# Patient Record
Sex: Female | Born: 1990 | Race: White | Hispanic: No | Marital: Single | Smoking: Current every day smoker
Health system: Southern US, Community
[De-identification: ages and names within clinical notes are randomized; demographics above are authoritative.]

## PROBLEM LIST (undated history)

## (undated) DIAGNOSIS — F603 Borderline personality disorder: Secondary | ICD-10-CM

## (undated) DIAGNOSIS — F329 Major depressive disorder, single episode, unspecified: Secondary | ICD-10-CM

## (undated) DIAGNOSIS — F419 Anxiety disorder, unspecified: Secondary | ICD-10-CM

## (undated) DIAGNOSIS — F4310 Post-traumatic stress disorder, unspecified: Secondary | ICD-10-CM

## (undated) DIAGNOSIS — F32A Depression, unspecified: Secondary | ICD-10-CM

## (undated) DIAGNOSIS — F259 Schizoaffective disorder, unspecified: Secondary | ICD-10-CM

## (undated) DIAGNOSIS — B1920 Unspecified viral hepatitis C without hepatic coma: Secondary | ICD-10-CM

## (undated) DIAGNOSIS — F319 Bipolar disorder, unspecified: Secondary | ICD-10-CM

## (undated) DIAGNOSIS — J45909 Unspecified asthma, uncomplicated: Secondary | ICD-10-CM

## (undated) HISTORY — DX: Anxiety disorder, unspecified: F41.9

## (undated) HISTORY — DX: Major depressive disorder, single episode, unspecified: F32.9

## (undated) HISTORY — DX: Bipolar disorder, unspecified: F31.9

## (undated) HISTORY — PX: TONSILLECTOMY: SUR1361

## (undated) HISTORY — DX: Depression, unspecified: F32.A

## (undated) HISTORY — DX: Post-traumatic stress disorder, unspecified: F43.10

## (undated) HISTORY — DX: Unspecified asthma, uncomplicated: J45.909

---
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
## 2010-10-15 LAB — GLUCOSE, CAPILLARY: Glucose-Capillary: 104 mg/dL — ABNORMAL HIGH (ref 70–99)

## 2010-10-16 LAB — HCG, SERUM, QUALITATIVE: Preg, Serum: NEGATIVE

## 2010-10-16 LAB — TSH: TSH: 9.894 u[IU]/mL — ABNORMAL HIGH (ref 0.700–6.400)

## 2010-10-16 LAB — GLUCOSE, CAPILLARY
Glucose-Capillary: 126 mg/dL — ABNORMAL HIGH (ref 70–99)
Glucose-Capillary: 135 mg/dL — ABNORMAL HIGH (ref 70–99)

## 2010-10-16 LAB — T3, FREE: T3, Free: 3.4 pg/mL (ref 2.3–4.2)

## 2017-08-05 ENCOUNTER — Ambulatory Visit: Payer: Self-pay | Admitting: Licensed Clinical Social Worker

## 2017-08-05 ENCOUNTER — Ambulatory Visit: Payer: Self-pay | Admitting: Urology

## 2017-08-05 VITALS — BP 120/77 | HR 83 | Temp 97.8°F | Wt 259.1 lb

## 2017-08-05 DIAGNOSIS — H539 Unspecified visual disturbance: Secondary | ICD-10-CM

## 2017-08-05 DIAGNOSIS — Z8619 Personal history of other infectious and parasitic diseases: Secondary | ICD-10-CM

## 2017-08-05 DIAGNOSIS — F3163 Bipolar disorder, current episode mixed, severe, without psychotic features: Secondary | ICD-10-CM

## 2017-08-05 DIAGNOSIS — J4520 Mild intermittent asthma, uncomplicated: Secondary | ICD-10-CM

## 2017-08-05 MED ORDER — ALBUTEROL SULFATE HFA 108 (90 BASE) MCG/ACT IN AERS: 2.0000 | INHALATION_SPRAY | Freq: Four times a day (QID) | RESPIRATORY_TRACT | 2 refills | Status: AC | PRN

## 2017-08-05 MED ORDER — NAPROXEN 500 MG PO TABS: 500.0000 mg | ORAL_TABLET | Freq: Two times a day (BID) | ORAL | 0 refills | Status: AC

## 2017-08-05 NOTE — Progress Notes (Signed)
  Patient: Robyn Melton Female    DOB: 12/30/1990   27 y.o.   MRN: [id number] Visit Date: 08/05/2017  Today's Provider: [facility]   Chief Complaint  Patient presents with  . Asthma    needs refill on albuterol inhaler   . Establish Care    inquiring about HepC medication; wants eye appt    Subjective:    HPI Patient would like an eye exam.  "My prescription is getting worse."    Has been out of her inhaler for three days.  Using it 2 to 3 times during the day.    When she was in prison told she could not receive the Hep C drug unless her liver was failing  Allergies  Allergen Reactions  . Banana   . Penicillins    Previous Medications   No medications on file    Review of Systems  Constitutional: Negative.   HENT: Negative.   Eyes: Negative.   Respiratory: Negative.   Cardiovascular: Negative.   Gastrointestinal: Negative.   Endocrine: Negative.   Genitourinary: Negative.   Musculoskeletal: Negative.   Skin: Negative.   Allergic/Immunologic: Negative.   Neurological: Negative.   Hematological: Negative.   Psychiatric/Behavioral: Negative.     Social History   Tobacco Use  . Smoking status: Current Every Day Smoker    Packs/day: 0.20    Types: Cigarettes    Start date: 03/29/2009  . Smokeless tobacco: Never Used  Substance Use Topics  . Alcohol use: No    Frequency: Never   Objective:   BP 120/77 (BP Location: Left Arm, Patient Position: Sitting, Cuff Size: Normal)   Pulse 83   Temp 97.8 F (36.6 C) (Oral)   Wt 259 lb 1.6 oz (117.5 kg)   LMP 07/15/2017 (Exact Date)   Physical Exam Constitutional: Well nourished. Alert and oriented, No acute distress. HEENT: [place] AT, moist mucus membranes. Trachea midline, no masses. Cardiovascular: No clubbing, cyanosis, or edema. Respiratory: Normal respiratory effort, no increased work of breathing. Skin: No rashes, bruises or suspicious lesions. Lymph: No cervical or inguinal  adenopathy. Neurologic: Grossly intact, no focal deficits, moving all 4 extremities. Psychiatric: Normal mood and affect.      Assessment & Plan:     1. Hep C  - refer to GI   - labs drawn tonight  2. Asthma  - albuterol inhaler prescribed  3. HA  - naproxen  RTC in one month         [facility]   [facility]

## 2017-08-06 ENCOUNTER — Encounter: Payer: Self-pay | Admitting: Licensed Clinical Social Worker

## 2017-08-06 NOTE — Progress Notes (Signed)
Clinician completed social determinants screening and PHQ 9. Recommendation for patient to follow up at least once weekly for depression and bipolar disorder.

## 2017-08-07 ENCOUNTER — Ambulatory Visit: Payer: Self-pay | Admitting: Ophthalmology

## 2017-08-12 ENCOUNTER — Ambulatory Visit: Payer: Self-pay

## 2017-08-12 LAB — CBC WITH DIFFERENTIAL/PLATELET
BASOS: 0 %
Basophils Absolute: 0 10*3/uL (ref 0.0–0.2)
EOS (ABSOLUTE): 0.2 10*3/uL (ref 0.0–0.4)
EOS: 2 %
HEMATOCRIT: 40.6 % (ref 34.0–46.6)
HEMOGLOBIN: 13.5 g/dL (ref 11.1–15.9)
IMMATURE GRANS (ABS): 0 10*3/uL (ref 0.0–0.1)
IMMATURE GRANULOCYTES: 0 %
Lymphocytes Absolute: 4 10*3/uL — ABNORMAL HIGH (ref 0.7–3.1)
Lymphs: 40 %
MCH: 27.8 pg (ref 26.6–33.0)
MCHC: 33.3 g/dL (ref 31.5–35.7)
MCV: 84 fL (ref 79–97)
Monocytes Absolute: 0.7 10*3/uL (ref 0.1–0.9)
Monocytes: 7 %
NEUTROS ABS: 5.1 10*3/uL (ref 1.4–7.0)
NEUTROS PCT: 51 %
Platelets: 276 10*3/uL (ref 150–379)
RBC: 4.85 x10E6/uL (ref 3.77–5.28)
RDW: 13.5 % (ref 12.3–15.4)
WBC: 10.1 10*3/uL (ref 3.4–10.8)

## 2017-08-12 LAB — COMPREHENSIVE METABOLIC PANEL
A/G RATIO: 1.2 (ref 1.2–2.2)
ALBUMIN: 3.9 g/dL (ref 3.5–5.5)
ALT: 53 IU/L — AB (ref 0–32)
AST: 31 IU/L (ref 0–40)
Alkaline Phosphatase: 81 IU/L (ref 39–117)
BUN/Creatinine Ratio: 21 (ref 9–23)
BUN: 15 mg/dL (ref 6–20)
Bilirubin Total: 0.2 mg/dL (ref 0.0–1.2)
CALCIUM: 9.3 mg/dL (ref 8.7–10.2)
CO2: 22 mmol/L (ref 20–29)
Chloride: 104 mmol/L (ref 96–106)
Creatinine, Ser: 0.71 mg/dL (ref 0.57–1.00)
GFR calc Af Amer: 136 mL/min/{1.73_m2} (ref >59)
GFR calc non Af Amer: 118 mL/min/{1.73_m2} (ref >59)
GLOBULIN, TOTAL: 3.2 g/dL (ref 1.5–4.5)
Glucose: 128 mg/dL — ABNORMAL HIGH (ref 65–99)
POTASSIUM: 4.1 mmol/L (ref 3.5–5.2)
Sodium: 140 mmol/L (ref 134–144)
Total Protein: 7.1 g/dL (ref 6.0–8.5)

## 2017-08-12 LAB — HEMOGLOBIN A1C
Est. average glucose Bld gHb Est-mCnc: 131 mg/dL
Hgb A1c MFr Bld: 6.2 % — ABNORMAL HIGH (ref 4.8–5.6)

## 2017-08-12 LAB — LIPID PANEL
CHOLESTEROL TOTAL: 151 mg/dL (ref 100–199)
Chol/HDL Ratio: 3.4 ratio (ref 0.0–4.4)
HDL: 45 mg/dL (ref >39)
LDL Calculated: 81 mg/dL (ref 0–99)
Triglycerides: 125 mg/dL (ref 0–149)
VLDL Cholesterol Cal: 25 mg/dL (ref 5–40)

## 2017-08-12 LAB — ETHANOL: ETHANOL LVL: NEGATIVE %

## 2017-08-12 LAB — TSH: TSH: 7.09 u[IU]/mL — ABNORMAL HIGH (ref 0.450–4.500)

## 2017-08-13 ENCOUNTER — Encounter: Payer: Self-pay | Admitting: *Deleted

## 2017-08-24 ENCOUNTER — Encounter: Payer: Self-pay | Admitting: Emergency Medicine

## 2017-08-24 ENCOUNTER — Emergency Department
Admission: EM | Admit: 2017-08-24 | Discharge: 2017-08-24 | Disposition: A | Discharge status: Home / Self Care | Payer: Medicaid Other | Attending: Emergency Medicine | Admitting: Emergency Medicine

## 2017-08-24 DIAGNOSIS — N390 Urinary tract infection, site not specified: Secondary | ICD-10-CM | POA: Diagnosis not present

## 2017-08-24 DIAGNOSIS — Z202 Contact with and (suspected) exposure to infections with a predominantly sexual mode of transmission: Secondary | ICD-10-CM | POA: Insufficient documentation

## 2017-08-24 DIAGNOSIS — F17210 Nicotine dependence, cigarettes, uncomplicated: Secondary | ICD-10-CM | POA: Insufficient documentation

## 2017-08-24 DIAGNOSIS — J45909 Unspecified asthma, uncomplicated: Secondary | ICD-10-CM | POA: Insufficient documentation

## 2017-08-24 DIAGNOSIS — R300 Dysuria: Secondary | ICD-10-CM | POA: Diagnosis present

## 2017-08-24 DIAGNOSIS — F1910 Other psychoactive substance abuse, uncomplicated: Secondary | ICD-10-CM

## 2017-08-24 DIAGNOSIS — N938 Other specified abnormal uterine and vaginal bleeding: Secondary | ICD-10-CM | POA: Insufficient documentation

## 2017-08-24 DIAGNOSIS — I10 Essential (primary) hypertension: Secondary | ICD-10-CM | POA: Diagnosis not present

## 2017-08-24 LAB — URINALYSIS, COMPLETE (UACMP) WITH MICROSCOPIC
BILIRUBIN URINE: NEGATIVE
Glucose, UA: NEGATIVE mg/dL
Ketones, ur: NEGATIVE mg/dL
NITRITE: NEGATIVE
PH: 6 (ref 5.0–8.0)
Protein, ur: NEGATIVE mg/dL
SPECIFIC GRAVITY, URINE: 1.006 (ref 1.005–1.030)

## 2017-08-24 LAB — POCT PREGNANCY, URINE: Preg Test, Ur: NEGATIVE

## 2017-08-24 MED ORDER — NITROFURANTOIN MONOHYD MACRO 100 MG PO CAPS
100.0000 mg | ORAL_CAPSULE | Freq: Once | ORAL | Status: AC
  Administered 2017-08-24: 100 mg via ORAL
  Filled 2017-08-24: qty 1

## 2017-08-24 MED ORDER — NITROFURANTOIN MONOHYD MACRO 100 MG PO CAPS: 100.0000 mg | ORAL_CAPSULE | Freq: Two times a day (BID) | ORAL | 0 refills | Status: AC

## 2017-08-24 NOTE — ED Notes (Signed)
Pt is requesting drug and alcohol tx and treatment for probable std after having multiple episodes of unprotected sex. Pt uses meth, marijuana and etoh - denies hi/si

## 2017-08-24 NOTE — ED Notes (Signed)
Pt being verbally aggressive - wants to stay since she is homeless. Had to get security to escort her out. Has the discharge with instructions to follow up with rts for outpatient detox.

## 2017-08-24 NOTE — Discharge Instructions (Signed)
Please seek medical attention for any high fevers, chest pain, shortness of breath, change in behavior, persistent vomiting, bloody stool or any other new or concerning symptoms.  

## 2017-08-24 NOTE — ED Triage Notes (Signed)
Pt reports pain with urination, cloudy urine, foul smell. Pt also reports bleeding after having sex. Would like to be tested for an STI. Pt also requesting detox from alcohol, marijuana and meth. Hx of hep c, reports using too much meth this weekend. Pt also requesting a release of information for her PO.

## 2017-08-24 NOTE — ED Provider Notes (Signed)
[facility] Emergency Department Provider Note   ____________________________________________   I have reviewed the triage vital signs and the nursing notes.   HISTORY  Chief Complaint Painful urination  History limited by: Not Limited   HPI Robyn Melton is a 27 y.o. female who presents to the emergency department today with a primary concern for painful urination. This has been going on for at least the past week. She states the pain is severe. In addition she has complaints of vaginal bleeding with intercourse. Furthermore the patient is concerned about relapsing on drug use. States that she left a rehab facility a few weeks ago and has started using again. The patient also states that she has concerns that she might have sexually transmitted disease due to sexual behavior.   Per medical record review patient has a history of PTSD, bipolar.   Past Medical History:  Diagnosis Date  . Anxiety   . Asthma   . Bipolar 1 disorder (HCC)   . Depression   . Hypertension   . PTSD (post-traumatic stress disorder)     There are no active problems to display for this patient.   Past Surgical History:  Procedure Laterality Date  . CESAREAN SECTION      Prior to Admission medications   Medication Sig Start Date End Date Taking? Authorizing Provider  albuterol (PROVENTIL HFA;VENTOLIN HFA) 108 (90 Base) MCG/ACT inhaler Inhale 2 puffs into the lungs every 6 (six) hours as needed for wheezing or shortness of breath. 08/05/17   McGowan, Carollee Herter A, PA-C  naproxen (NAPROSYN) 500 MG tablet Take 1 tablet (500 mg total) by mouth 2 (two) times daily with a meal. 08/05/17   McGowan, Carollee Herter A, PA-C    Allergies Banana and Penicillins  Family History  Problem Relation Age of Onset  . COPD Mother   . Asthma Mother   . Diabetes Mother   . Heart disease Mother   . Hypertension Mother   . Asthma Father   . Diabetes Father   . Kidney disease Father   . Hypertension  Father   . Cancer Father     Social History Social History   Tobacco Use  . Smoking status: Current Every Day Smoker    Packs/day: 0.20    Types: Cigarettes    Start date: 03/29/2009  . Smokeless tobacco: Never Used  Substance Use Topics  . Alcohol use: No    Frequency: Never  . 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     Review of Systems Constitutional: No fever/chills Eyes: No visual changes. ENT: No sore throat. Cardiovascular: Denies chest pain. Respiratory: Denies shortness of breath. Gastrointestinal: No abdominal pain.  No nausea, no vomiting.  No diarrhea.   Genitourinary: Positive for dysuria. Positive for vaginal bleeding. Musculoskeletal: Negative for back pain. Skin: Negative for rash. Neurological: Negative for headaches, focal weakness or numbness.  ____________________________________________   PHYSICAL EXAM:  VITAL SIGNS: ED Triage Vitals  Enc Vitals Group     BP 08/24/17 1356 130/80     Pulse Rate 08/24/17 1356 (!) 110     Resp 08/24/17 1356 18     Temp 08/24/17 1356 97.8 F (36.6 C)     Temp Source 08/24/17 1356 Oral     SpO2 08/24/17 1356 98 %     Weight 08/24/17 1356 250 lb (113.4 kg)     Height 08/24/17 1356 5\' 6"  (1.676 m)     Head Circumference --  Peak Flow --      Pain Score 08/24/17 1400 7   Constitutional: Alert and oriented. Well appearing and in no distress. Eyes: Conjunctivae are normal.  ENT   Head: Normocephalic and atraumatic.   Nose: No congestion/rhinnorhea.   Mouth/Throat: Mucous membranes are moist.   Neck: No stridor. Hematological/Lymphatic/Immunilogical: No cervical lymphadenopathy. Cardiovascular: Normal rate, regular rhythm.  No murmurs, rubs, or gallops.  Respiratory: Normal respiratory effort without tachypnea nor retractions. Breath sounds are clear and equal bilaterally. No wheezes/rales/rhonchi. Gastrointestinal: Soft and non tender. No rebound. No  guarding.  Genitourinary: Deferred Musculoskeletal: Normal range of motion in all extremities. No lower extremity edema. Neurologic:  Normal speech and language. No gross focal neurologic deficits are appreciated.  Skin:  Skin is warm, dry and intact. No rash noted. Psychiatric: Mood and affect are normal. Speech and behavior are normal. Patient exhibits appropriate insight and judgment.  ____________________________________________    LABS (pertinent positives/negatives)  Upreg negative UA large leukocytes, wbc too numerous to count  ____________________________________________   EKG  None  ____________________________________________    RADIOLOGY  None  ____________________________________________   PROCEDURES  Procedures  ____________________________________________   INITIAL IMPRESSION / ASSESSMENT AND PLAN / ED COURSE  Pertinent labs & imaging results that were available during my care of the patient were reviewed by me and considered in my medical decision making (see chart for details).  Patient presented to the emergency department today with primary concern for painful urination.  Concern would be for urinary tract infection.  Urine is consistent with a urinary tract infection.  The patient had multiple secondary concerns.  One was for substance abuse.  Patient states that she has recently started using illicit drugs again. She also is concerned that she might have an STI given sexual activity. I had a long discussion with the patient about her multiple complaints. It does appear that the patient has a current UTI, discussed that we would prescribe antibiotics for her. Will give first dose here in the emergency department. Additionally discussed with patient that we do not do detox in the emergency department and that will give patient RTS information. In terms of STI discussed with patient the importance of full STI testing and will give patient information for  health department. Is having some vaginal bleeding but no other signs or symptoms of concerning STI. When I discussed the importance of follow up with the patient she became quite upset. She stated she wanted to be admitted. Stated that she has no where to go. Discussed with the patient that she did not meet any admission criteria.   ____________________________________________   FINAL CLINICAL IMPRESSION(S) / ED DIAGNOSES  Final diagnoses:  Lower urinary tract infection  Polysubstance abuse (HCC)     Note: This dictation was prepared with Dragon dictation. Any transcriptional errors that result from this process are unintentional     Phineas SemenGoodman, Kimbree Casanas, MD 08/24/17 1842

## 2017-09-10 ENCOUNTER — Telehealth: Payer: Self-pay

## 2017-09-10 NOTE — Telephone Encounter (Signed)
Lm for pt to call back re: scheduling an appt.  Patient needs an appointment to discuss treatment for her hypothyroidism

## 2017-09-10 NOTE — Telephone Encounter (Signed)
-----   Message from Ezekiel InaLorrie D Carter sent at 09/05/2017 10:00 AM EST -----   ----- Message ----- From: Harle BattiestMcGowan, Shannon A, PA-C Sent: 09/04/2017  10:00 PM To: Ezekiel InaLorrie D Carter  Patient needs an appointment to discuss treatment for her hypothyroidism.

## 2018-02-15 ENCOUNTER — Emergency Department
Admission: EM | Admit: 2018-02-15 | Discharge: 2018-02-15 | Disposition: A | Discharge status: Home / Self Care | Payer: Medicaid Other | Attending: Emergency Medicine | Admitting: Emergency Medicine

## 2018-02-15 ENCOUNTER — Encounter: Payer: Self-pay | Admitting: Emergency Medicine

## 2018-02-15 DIAGNOSIS — J45909 Unspecified asthma, uncomplicated: Secondary | ICD-10-CM | POA: Diagnosis not present

## 2018-02-15 DIAGNOSIS — R109 Unspecified abdominal pain: Secondary | ICD-10-CM

## 2018-02-15 DIAGNOSIS — R1110 Vomiting, unspecified: Secondary | ICD-10-CM | POA: Diagnosis not present

## 2018-02-15 DIAGNOSIS — R739 Hyperglycemia, unspecified: Secondary | ICD-10-CM | POA: Diagnosis not present

## 2018-02-15 DIAGNOSIS — F17210 Nicotine dependence, cigarettes, uncomplicated: Secondary | ICD-10-CM | POA: Insufficient documentation

## 2018-02-15 DIAGNOSIS — R112 Nausea with vomiting, unspecified: Secondary | ICD-10-CM

## 2018-02-15 HISTORY — DX: Unspecified viral hepatitis C without hepatic coma: B19.20

## 2018-02-15 LAB — COMPREHENSIVE METABOLIC PANEL
ALBUMIN: 3.4 g/dL — AB (ref 3.5–5.0)
ALT: 55 U/L — ABNORMAL HIGH (ref 0–44)
AST: 40 U/L (ref 15–41)
Alkaline Phosphatase: 84 U/L (ref 38–126)
Anion gap: 6 (ref 5–15)
BILIRUBIN TOTAL: 0.3 mg/dL (ref 0.3–1.2)
BUN: 13 mg/dL (ref 6–20)
CO2: 24 mmol/L (ref 22–32)
Calcium: 8.8 mg/dL — ABNORMAL LOW (ref 8.9–10.3)
Chloride: 104 mmol/L (ref 98–111)
Creatinine, Ser: 0.67 mg/dL (ref 0.44–1.00)
GFR calc Af Amer: >60 mL/min (ref >60)
GFR calc non Af Amer: >60 mL/min (ref >60)
Glucose, Bld: 323 mg/dL — ABNORMAL HIGH (ref 70–99)
POTASSIUM: 4.2 mmol/L (ref 3.5–5.1)
Sodium: 134 mmol/L — ABNORMAL LOW (ref 135–145)
TOTAL PROTEIN: 7.4 g/dL (ref 6.5–8.1)

## 2018-02-15 LAB — CBC
HEMATOCRIT: 41 % (ref 35.0–47.0)
Hemoglobin: 14.2 g/dL (ref 12.0–16.0)
MCH: 28.3 pg (ref 26.0–34.0)
MCHC: 34.7 g/dL (ref 32.0–36.0)
MCV: 81.7 fL (ref 80.0–100.0)
Platelets: 295 10*3/uL (ref 150–440)
RBC: 5.02 MIL/uL (ref 3.80–5.20)
RDW: 13.2 % (ref 11.5–14.5)
WBC: 12.5 10*3/uL — AB (ref 3.6–11.0)

## 2018-02-15 LAB — URINALYSIS, COMPLETE (UACMP) WITH MICROSCOPIC
BACTERIA UA: NONE SEEN
Bilirubin Urine: NEGATIVE
Glucose, UA: >=500 mg/dL — AB
Hgb urine dipstick: NEGATIVE
Ketones, ur: NEGATIVE mg/dL
LEUKOCYTES UA: NEGATIVE
Nitrite: NEGATIVE
PH: 5 (ref 5.0–8.0)
Protein, ur: NEGATIVE mg/dL
Specific Gravity, Urine: 1.028 (ref 1.005–1.030)

## 2018-02-15 LAB — POCT PREGNANCY, URINE: PREG TEST UR: NEGATIVE

## 2018-02-15 LAB — LIPASE, BLOOD: Lipase: 32 U/L (ref 11–51)

## 2018-02-15 LAB — HCG, QUANTITATIVE, PREGNANCY

## 2018-02-15 LAB — PREGNANCY, URINE: PREG TEST UR: NEGATIVE

## 2018-02-15 MED ORDER — ONDANSETRON 4 MG PO TBDP
4.0000 mg | ORAL_TABLET | Freq: Once | ORAL | Status: AC | PRN
  Administered 2018-02-15: 4 mg via ORAL
  Filled 2018-02-15: qty 1

## 2018-02-15 MED ORDER — METOCLOPRAMIDE HCL 10 MG PO TABS: 10.0000 mg | ORAL_TABLET | Freq: Three times a day (TID) | ORAL | 1 refills | Status: AC | PRN

## 2018-02-15 MED ORDER — SODIUM CHLORIDE 0.9 % IV SOLN
Freq: Once | INTRAVENOUS | Status: AC
  Administered 2018-02-15: 22:00:00 via INTRAVENOUS

## 2018-02-15 MED ORDER — GLIPIZIDE 5 MG PO TABS: 5.0000 mg | ORAL_TABLET | Freq: Two times a day (BID) | ORAL | 11 refills | Status: AC

## 2018-02-15 MED ORDER — METOCLOPRAMIDE HCL 5 MG/ML IJ SOLN
10.0000 mg | Freq: Once | INTRAMUSCULAR | Status: AC
  Administered 2018-02-15: 10 mg via INTRAVENOUS

## 2018-02-15 MED ORDER — METOCLOPRAMIDE HCL 5 MG/ML IJ SOLN
INTRAMUSCULAR | Status: AC
  Filled 2018-02-15: qty 2

## 2018-02-15 NOTE — ED Triage Notes (Signed)
Patient with complaint of generalized abdominal pain and vomiting times 4 days. Patient reports that she vomited times one today. Patient states that she had diarrhea two days ago but none today.

## 2018-02-15 NOTE — ED Notes (Signed)
ED Provider at bedside.  Pt reports diffuse abdominal pain for the last 3 days with nausea, reports hx of IV drug abuse, Hep C, and DM and just beginning Januvia  O&A x 4

## 2018-02-15 NOTE — ED Notes (Signed)
Peripheral IV discontinued. Catheter intact. No signs of infiltration or redness. Gauze applied to IV site.    Discharge instructions reviewed with patient. Questions fielded by this RN. Patient verbalizes understanding of instructions. Patient discharged home in stable condition per williams. No acute distress noted at time of discharge.   

## 2018-02-15 NOTE — ED Provider Notes (Addendum)
[facility] Emergency Department Provider Note       Time seen: ----------------------------------------- 9:27 PM on 02/15/2018 -----------------------------------------   I have reviewed the triage vital signs and the nursing notes.  HISTORY   Chief Complaint Abdominal Pain and Emesis    HPI Robyn Melton is a 27 y.o. female with a history of anxiety, asthma, bipolar disorder, depression, hepatitis C and PTSD who presents to the ED for abdominal pain and vomiting for the past 4 days.  Patient states she only vomited once today.  She had diarrhea 2 days ago but none today.  She denies fevers, chills or other complaints.  She is also concerned that she could be pregnant.  Past Medical History:  Diagnosis Date  . Anxiety   . Asthma   . Bipolar 1 disorder (HCC)   . Depression   . Hepatitis C   . PTSD (post-traumatic stress disorder)     There are no active problems to display for this patient.   Past Surgical History:  Procedure Laterality Date  . CESAREAN SECTION     times 2  . TONSILLECTOMY      Allergies Banana and Penicillins  Social History Social History   Tobacco Use  . Smoking status: Current Every Day Smoker    Packs/day: 0.20    Types: Cigarettes    Start date: 03/29/2009  . Smokeless tobacco: Never Used  Substance Use Topics  . Alcohol use: No    Frequency: Never  . 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    Review of Systems Constitutional: Negative for fever. Cardiovascular: Negative for chest pain. Respiratory: Negative for shortness of breath. Gastrointestinal: Abdominal pain as well as vomiting and diarrhea Genitourinary: Negative for dysuria. Musculoskeletal: Negative for back pain. Skin: Negative for rash. Neurological: Negative for headaches, focal weakness or numbness.  All systems negative/normal/unremarkable except as stated in  the HPI  ____________________________________________   PHYSICAL EXAM:  VITAL SIGNS: ED Triage Vitals  Enc Vitals Group     BP 02/15/18 2005 120/78     Pulse Rate 02/15/18 2005 (!) 101     Resp 02/15/18 2005 18     Temp 02/15/18 2005 98.6 F (37 C)     Temp Source 02/15/18 2005 Oral     SpO2 02/15/18 2005 97 %     Weight 02/15/18 2006 270 lb (122.5 kg)     Height 02/15/18 2006 5\' 6"  (1.676 m)     Head Circumference --      Peak Flow --      Pain Score 02/15/18 2005 4     Pain Loc --      Pain Edu? --      Excl. in GC? --    Constitutional: Alert and oriented. Well appearing and in no distress. Eyes: Conjunctivae are normal. Normal extraocular movements. Cardiovascular: Normal rate, regular rhythm. No murmurs, rubs, or gallops. Respiratory: Normal respiratory effort without tachypnea nor retractions. Breath sounds are clear and equal bilaterally. No wheezes/rales/rhonchi. Gastrointestinal: Soft and nontender. Normal bowel sounds Musculoskeletal: Nontender with normal range of motion in extremities. No lower extremity tenderness nor edema. Neurologic:  Normal speech and language. No gross focal neurologic deficits are appreciated.  Skin:  Skin is warm, dry and intact. No rash noted. Psychiatric: Mood and affect are normal. Speech and behavior are normal.  ____________________________________________  ED COURSE:  As part of my medical decision making,  I reviewed the following data within the electronic MEDICAL RECORD NUMBER History obtained from family if available, nursing notes, old chart and ekg, as well as notes from prior ED visits. Patient presented for abdominal pain as well as vomiting and diarrhea, we will assess with labs and treat for likely gastroenteritis.   Procedures ____________________________________________   LABS (pertinent positives/negatives)  Labs Reviewed  COMPREHENSIVE METABOLIC PANEL - Abnormal; Notable for the following components:      Result Value    Sodium 134 (*)    Glucose, Bld 323 (*)    Calcium 8.8 (*)    Albumin 3.4 (*)    ALT 55 (*)    All other components within normal limits  CBC - Abnormal; Notable for the following components:   WBC 12.5 (*)    All other components within normal limits  URINALYSIS, COMPLETE (UACMP) WITH MICROSCOPIC - Abnormal; Notable for the following components:   Color, Urine YELLOW (*)    APPearance CLEAR (*)    Glucose, UA >=500 (*)    All other components within normal limits  CHLAMYDIA/NGC RT PCR ([facility] ONLY)  LIPASE, BLOOD  PREGNANCY, URINE  HCG, QUANTITATIVE, PREGNANCY  POCT PREGNANCY, URINE  ____________________________________________  DIFFERENTIAL DIAGNOSIS   Gastroparesis, dehydration, electrolyte abnormality, medication noncompliance, gastroenteritis  FINAL ASSESSMENT AND PLAN  Abdominal pain, vomiting, hyperglycemia   Plan: The patient had presented for abdominal pain and vomiting with recent diarrhea. Patient's labs did reveal hyperglycemia but no other acute process.  She was given fluids and Reglan with improvement in her symptoms.  She will be discharged with Reglan for nausea and I will prescribe glipizide for her to start taking since she can't tolerate metformin.   Ulice Dash, MD   Note: This note was generated in part or whole with voice recognition software. Voice recognition is usually quite accurate but there are transcription errors that can and very often do occur. I apologize for any typographical errors that were not detected and corrected.     Emily Filbert, MD 02/15/18 2225    Emily Filbert, MD 02/15/18 2232

## 2018-03-11 ENCOUNTER — Ambulatory Visit: Payer: Medicaid Other | Admitting: Pharmacy Technician

## 2018-03-11 NOTE — Progress Notes (Signed)
Patient scheduled for eligibility appointment at [facility].  Patient did not show for the appointment on August 28, at 2:00p.m.  Patient did not reschedule eligibility appointment.  [facility] unable to provide additional medication assistance until eligibility is determined.  Sherilyn DacostaBetty J. Lindon Kiel Care Manager [facility]

## 2018-04-28 ENCOUNTER — Other Ambulatory Visit: Payer: Self-pay | Admitting: Student

## 2018-04-28 DIAGNOSIS — B182 Chronic viral hepatitis C: Secondary | ICD-10-CM

## 2018-05-04 ENCOUNTER — Ambulatory Visit
Admission: RE | Admit: 2018-05-04 | Discharge: 2018-05-04 | Disposition: A | Discharge status: Home / Self Care | Payer: Medicaid Other | Source: Ambulatory Visit | Attending: Student | Admitting: Student

## 2018-05-04 DIAGNOSIS — B182 Chronic viral hepatitis C: Secondary | ICD-10-CM | POA: Insufficient documentation

## 2018-05-27 ENCOUNTER — Emergency Department
Admission: EM | Admit: 2018-05-27 | Discharge: 2018-05-29 | Disposition: A | Discharge status: Home / Self Care | Payer: Medicaid Other | Attending: Emergency Medicine | Admitting: Emergency Medicine

## 2018-05-27 ENCOUNTER — Other Ambulatory Visit: Payer: Self-pay

## 2018-05-27 DIAGNOSIS — F603 Borderline personality disorder: Secondary | ICD-10-CM

## 2018-05-27 DIAGNOSIS — R45851 Suicidal ideations: Secondary | ICD-10-CM | POA: Diagnosis not present

## 2018-05-27 DIAGNOSIS — Z79899 Other long term (current) drug therapy: Secondary | ICD-10-CM | POA: Insufficient documentation

## 2018-05-27 DIAGNOSIS — J45909 Unspecified asthma, uncomplicated: Secondary | ICD-10-CM | POA: Diagnosis not present

## 2018-05-27 DIAGNOSIS — F329 Major depressive disorder, single episode, unspecified: Secondary | ICD-10-CM | POA: Insufficient documentation

## 2018-05-27 DIAGNOSIS — F259 Schizoaffective disorder, unspecified: Secondary | ICD-10-CM | POA: Diagnosis not present

## 2018-05-27 DIAGNOSIS — F17210 Nicotine dependence, cigarettes, uncomplicated: Secondary | ICD-10-CM | POA: Diagnosis not present

## 2018-05-27 DIAGNOSIS — F250 Schizoaffective disorder, bipolar type: Secondary | ICD-10-CM | POA: Diagnosis not present

## 2018-05-27 DIAGNOSIS — Z046 Encounter for general psychiatric examination, requested by authority: Secondary | ICD-10-CM | POA: Diagnosis present

## 2018-05-27 DIAGNOSIS — F1510 Other stimulant abuse, uncomplicated: Secondary | ICD-10-CM

## 2018-05-27 HISTORY — DX: Schizoaffective disorder, unspecified: F25.9

## 2018-05-27 HISTORY — DX: Borderline personality disorder: F60.3

## 2018-05-27 LAB — COMPREHENSIVE METABOLIC PANEL
ALK PHOS: 96 U/L (ref 38–126)
ALT: 62 U/L — AB (ref 0–44)
ANION GAP: 7 (ref 5–15)
AST: 33 U/L (ref 15–41)
Albumin: 3.5 g/dL (ref 3.5–5.0)
BUN: 15 mg/dL (ref 6–20)
CALCIUM: 9.1 mg/dL (ref 8.9–10.3)
CO2: 28 mmol/L (ref 22–32)
CREATININE: 0.71 mg/dL (ref 0.44–1.00)
Chloride: 100 mmol/L (ref 98–111)
GFR calc Af Amer: >60 mL/min (ref >60)
GFR calc non Af Amer: >60 mL/min (ref >60)
Glucose, Bld: 419 mg/dL — ABNORMAL HIGH (ref 70–99)
Potassium: 4.2 mmol/L (ref 3.5–5.1)
SODIUM: 135 mmol/L (ref 135–145)
Total Bilirubin: 0.6 mg/dL (ref 0.3–1.2)
Total Protein: 7.3 g/dL (ref 6.5–8.1)

## 2018-05-27 LAB — SALICYLATE LEVEL: Salicylate Lvl: <7 mg/dL (ref 2.8–30.0)

## 2018-05-27 LAB — CBC
HCT: 42 % (ref 36.0–46.0)
HEMOGLOBIN: 14.1 g/dL (ref 12.0–15.0)
MCH: 27.8 pg (ref 26.0–34.0)
MCHC: 33.6 g/dL (ref 30.0–36.0)
MCV: 82.8 fL (ref 80.0–100.0)
NRBC: 0 % (ref 0.0–0.2)
Platelets: 303 10*3/uL (ref 150–400)
RBC: 5.07 MIL/uL (ref 3.87–5.11)
RDW: 12.3 % (ref 11.5–15.5)
WBC: 9.9 10*3/uL (ref 4.0–10.5)

## 2018-05-27 LAB — URINE DRUG SCREEN, QUALITATIVE (ARMC ONLY)
AMPHETAMINES, UR SCREEN: NOT DETECTED
BARBITURATES, UR SCREEN: NOT DETECTED
BENZODIAZEPINE, UR SCRN: NOT DETECTED
CANNABINOID 50 NG, UR ~~LOC~~: NOT DETECTED
COCAINE METABOLITE, UR ~~LOC~~: NOT DETECTED
MDMA (ECSTASY) UR SCREEN: NOT DETECTED
METHADONE SCREEN, URINE: NOT DETECTED
Opiate, Ur Screen: NOT DETECTED
Phencyclidine (PCP) Ur S: NOT DETECTED
TRICYCLIC, UR SCREEN: NOT DETECTED

## 2018-05-27 LAB — ETHANOL: Alcohol, Ethyl (B): <10 mg/dL (ref <10)

## 2018-05-27 LAB — ACETAMINOPHEN LEVEL

## 2018-05-27 LAB — PREGNANCY, URINE: PREG TEST UR: NEGATIVE

## 2018-05-27 NOTE — ED Triage Notes (Signed)
Pt to the er for psych eval. Pt says she is becoming more short tempered and anxious and depressed. Pt is not taking meds due to finances and she is unstable. Pt denies SI now but was last week

## 2018-05-27 NOTE — ED Provider Notes (Signed)
[facility] EMERGENCY DEPARTMENT Provider Note   CSN: [id number] Arrival date & time: 05/27/18  2009     History   Chief Complaint Chief Complaint  Patient presents with  . Psychiatric Evaluation    HPI Robyn Melton is a 27 y.o. female hx of anxiety, bipolar, PTSD, schizoaffective disorder, here presenting with hallucinations.  Patient states that she has been living with her sponsor.  She has been having worsening anxiety and nightmares.  Also has some auditory hallucinations.  She states that about a week ago the hallucinations were telling her to kill herself and she had some suicidal ideations at that time.  Over this past week, her suicidal ideations improved.  However the voices are still telling her that she is worthless and that all the people do not like her.  Patient states that she stopped taking Lexapro several months ago and is not currently on any psychiatric medicines.  Patient has multiple psych admissions in the past.  Patient request to get help and is currently voluntary.  She adamantly denies any alcohol use or drug abuse.  The history is provided by the patient.    Past Medical History:  Diagnosis Date  . Anxiety   . Asthma   . Bipolar 1 disorder (HCC)   . Borderline personality disorder (HCC)   . Depression   . Hepatitis C   . PTSD (post-traumatic stress disorder)   . Schizoaffective disorder (HCC)     There are no active problems to display for this patient.   Past Surgical History:  Procedure Laterality Date  . CESAREAN SECTION     times 2  . TONSILLECTOMY       OB History   None      Home Medications    Prior to Admission medications   Medication Sig Start Date End Date Taking? Authorizing Provider  albuterol (PROVENTIL HFA;VENTOLIN HFA) 108 (90 Base) MCG/ACT inhaler Inhale 2 puffs into the lungs every 6 (six) hours as needed for wheezing or shortness of breath. 08/05/17   McGowan, Carollee Herter A, PA-C  glipiZIDE  (GLUCOTROL) 5 MG tablet Take 1 tablet (5 mg total) by mouth 2 (two) times daily. 02/15/18 02/15/19  Emily Filbert, MD  metoCLOPramide (REGLAN) 10 MG tablet Take 1 tablet (10 mg total) by mouth every 8 (eight) hours as needed for nausea or vomiting. 02/15/18   Emily Filbert, MD  naproxen (NAPROSYN) 500 MG tablet Take 1 tablet (500 mg total) by mouth 2 (two) times daily with a meal. 08/05/17   McGowan, Wellington [place], PA-C    Family History Family History  Problem Relation Age of Onset  . COPD Mother   . Asthma Mother   . Diabetes Mother   . Heart disease Mother   . Hypertension Mother   . Asthma Father   . Diabetes Father   . Kidney disease Father   . Hypertension Father   . Cancer Father     Social History Social History   Tobacco Use  . Smoking status: Current Every Day Smoker    Packs/day: 0.20    Types: Cigarettes    Start date: 03/29/2009  . Smokeless tobacco: Never Used  Substance Use Topics  . Alcohol use: No    Frequency: Never  . 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      Allergies   Banana; Penicillins; and Seroquel [quetiapine fumarate]  Review of Systems Review of Systems  Psychiatric/Behavioral: Positive for dysphoric mood and suicidal ideas.  All other systems reviewed and are negative.    Physical Exam Updated Vital Signs BP (!) 108/57 (BP Location: Left Arm)   Pulse 88   Temp 98.3 F (36.8 C) (Oral)   Resp 18   Ht 5\' 5"  (1.651 m)   Wt 124.7 kg   LMP  (LMP Unknown)   SpO2 96%   BMI 45.76 kg/m   Physical Exam  Constitutional: She is oriented to person, place, and time. She appears well-developed.  HENT:  Head: Normocephalic.  Mouth/Throat: Oropharynx is clear and moist.  Eyes: Pupils are equal, round, and reactive to light. Conjunctivae and EOM are normal.  Neck: Normal range of motion. Neck supple.  Cardiovascular: Normal rate, regular rhythm and normal  heart sounds.  Pulmonary/Chest: Effort normal and breath sounds normal. No stridor. No respiratory distress.  Abdominal: Soft. Bowel sounds are normal. She exhibits no distension. There is no tenderness.  Musculoskeletal: Normal range of motion.  Neurological: She is alert and oriented to person, place, and time.  Skin: Skin is warm.  Psychiatric:  Depressed, responding to internal stimuli   Nursing note and vitals reviewed.    ED Treatments / Results  Labs (all labs ordered are listed, but only abnormal results are displayed) Labs Reviewed  CBC  COMPREHENSIVE METABOLIC PANEL  ETHANOL  SALICYLATE LEVEL  ACETAMINOPHEN LEVEL  URINE DRUG SCREEN, QUALITATIVE ([facility] ONLY)  POC URINE PREG, ED    EKG None  Radiology No results found.  Procedures Procedures (including critical care time)  Medications Ordered in ED Medications - No data to display   Initial Impression / Assessment and Plan / ED Course  I have reviewed the triage vital signs and the nursing notes.  Pertinent labs & imaging results that were available during my care of the patient were reviewed by me and considered in my medical decision making (see chart for details).    Robyn Melton is a 27 y.o. female here with suicidal ideation, hallucinations. Hx of schizoaffective disorder and not currently taking meds. Denies drug use. Will get psych clearance labs and consult TTS.   11:24 PM Medically cleared for psych consult.    Final Clinical Impressions(s) / ED Diagnoses   Final diagnoses:  None    ED Discharge Orders    None       Charlynne PanderYao, Tyland Klemens Hsienta, MD 05/27/18 2324

## 2018-05-28 ENCOUNTER — Inpatient Hospital Stay: Admission: RE | Admit: 2018-05-28 | Payer: Medicaid Other | Source: Intra-hospital | Admitting: Psychiatry

## 2018-05-28 DIAGNOSIS — F250 Schizoaffective disorder, bipolar type: Secondary | ICD-10-CM

## 2018-05-28 DIAGNOSIS — F603 Borderline personality disorder: Secondary | ICD-10-CM

## 2018-05-28 DIAGNOSIS — F1510 Other stimulant abuse, uncomplicated: Secondary | ICD-10-CM

## 2018-05-28 DIAGNOSIS — F259 Schizoaffective disorder, unspecified: Secondary | ICD-10-CM

## 2018-05-28 LAB — GLUCOSE, CAPILLARY
GLUCOSE-CAPILLARY: 203 mg/dL — AB (ref 70–99)
Glucose-Capillary: 259 mg/dL — ABNORMAL HIGH (ref 70–99)
Glucose-Capillary: 350 mg/dL — ABNORMAL HIGH (ref 70–99)
Glucose-Capillary: 193 mg/dL — ABNORMAL HIGH (ref 70–99)

## 2018-05-28 MED ORDER — INSULIN ASPART 100 UNIT/ML ~~LOC~~ SOLN
0.0000 [IU] | Freq: Three times a day (TID) | SUBCUTANEOUS | Status: DC
  Administered 2018-05-28: 5 [IU] via SUBCUTANEOUS
  Administered 2018-05-28: 11 [IU] via SUBCUTANEOUS
  Administered 2018-05-29: 3 [IU] via SUBCUTANEOUS
  Administered 2018-05-29: 5 [IU] via SUBCUTANEOUS

## 2018-05-28 MED ORDER — INSULIN ASPART 100 UNIT/ML ~~LOC~~ SOLN
4.0000 [IU] | Freq: Three times a day (TID) | SUBCUTANEOUS | Status: DC
  Administered 2018-05-28 – 2018-05-29 (×3): 4 [IU] via SUBCUTANEOUS
  Filled 2018-05-28: qty 1

## 2018-05-28 NOTE — ED Notes (Signed)
Hourly rounding reveals patient in room. No complaints, stable, in no acute distress. Q15 minute rounds and monitoring via Security Cameras to continue. 

## 2018-05-28 NOTE — ED Notes (Signed)
Hourly rounding reveals patient sleeping in room. No complaints, stable, in no acute distress. Q15 minute rounds and monitoring via Security Cameras to continue. 

## 2018-05-28 NOTE — Consult Note (Signed)
[facility] Consult   Reason for Consult: Consult for this 27 year old woman with a history of chronic mental health problems or comes voluntarily to the emergency room Referring Physician: McShane Patient Identification: Analycia Melton MRN:  [id number] Principal Diagnosis: Schizoaffective disorder [facility]) Diagnosis:   Patient Active Problem List   Diagnosis Date Noted  . Borderline personality disorder ([place]) [F60.3] 05/28/2018  . Schizoaffective disorder ([place]) [F25.9] 05/28/2018  . Amphetamine abuse ([place]) [F15.10] 05/28/2018    Total Time spent with patient: 1 hour  Subjective:   Robyn Melton is a 27 y.o. female patient admitted with "I am overwhelmed".  HPI: Patient seen chart reviewed.  27 year old woman with a history of schizoaffective disorder and substance abuse came voluntarily to the emergency room.  She says for the past 1-1/2 weeks she has been feeling completely overwhelmed.  She is living in a home with several other people and feels that it is stressful having to be around other people and sharing responsibility.  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  She is sleeping poorly at night.  She is feeling paranoid and feeling like people are talking about her.  Mood has been irritable and angry.  No active suicidal or homicidal intent.  Patient is not currently on any psychiatric medicine at all at her preference.  She says that she has been sober for 60 days now.  Social history: Living with her sponsor and some of her sponsors extended family.  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  Medical history: Overweight.  No active medical issues otherwise.  Substance abuse history: Patient says she was using intravenous amphetamine until about 60 days ago.  Has been sober since then.  Past Psychiatric History: Past history of hospitalization and diagnosis  with schizoaffective disorder.  Past history of self injury.  Patient has been on medications in the past and feels like although they helped her to feel better she does not want to be on them because it made her feel dependent on the medicine.  Risk to Self: Suicidal Ideation: No Suicidal Intent: No-Not Currently/Within Last 6 Months Is patient at risk for suicide?: No Suicidal Plan?: No Access to Means: No What has been your use of drugs/alcohol within the last 12 months?: denies use How many times?: 0 Other Self Harm Risks: hx of cutting Triggers for Past Attempts: Unknown Intentional Self Injurious Behavior: Cutting Risk to Others: Homicidal Ideation: No Thoughts of Harm to Others: No Current Homicidal Intent: No Current Homicidal Plan: No Access to Homicidal Means: No Identified Victim: n/a History of harm to others?: No Assessment of Violence: None Noted Violent Behavior Description: get angry easily Does patient have access to weapons?: No Criminal Charges Pending?: No Does patient have a court date: No Prior Inpatient Therapy: Prior Inpatient Therapy: Yes Prior Therapy Facilty/Provider(s): [facility] Reason for Treatment: Depression Prior Outpatient Therapy: Prior Outpatient Therapy: No Does patient have an ACCT team?: No Does patient have Intensive In-House Services?  : No Does patient have [facility] services? : No Does patient have P4CC services?: No  Past Medical History:  Past Medical History:  Diagnosis Date  . Anxiety   . Asthma   . Bipolar 1 disorder ([place])   . Borderline personality disorder ([place])   . Depression   . Hepatitis C   . PTSD (post-traumatic stress disorder)   . Schizoaffective disorder ([place])  Past Surgical History:  Procedure Laterality Date  . CESAREAN SECTION     times 2  . TONSILLECTOMY     Family History:  Family History  Problem Relation Age of Onset  . COPD Mother   . Asthma Mother   . Diabetes Mother   . Heart disease Mother    . Hypertension Mother   . Asthma Father   . Diabetes Father   . Kidney disease Father   . Hypertension Father   . Cancer Father    Family Psychiatric  History: Family history positive for anxiety and had an aunt who killed herself Social History:  Social History   Substance and Sexual Activity  Alcohol Use No  . Frequency: Never     Social History   Substance and Sexual Activity  Drug Use No  . Types: Methamphetamines, Heroin   Comment: Patient is in recovery from [place] and heroin.     Social History   Socioeconomic History  . Marital status: Single    Spouse name: Not on file  . Number of children: Not on file  . Years of education: some college  . Highest education level: Some college, no degree  Occupational History    Comment: [employer] part time  Social Needs  . Financial resource strain: Somewhat hard  . Food insecurity:    Worry: Never true    Inability: Never true  . Transportation needs:    Medical: Yes    Non-medical: Yes  Tobacco Use  . Smoking status: Current Every Day Smoker    Packs/day: 0.20    Types: Cigarettes    Start date: 03/29/2009  . Smokeless tobacco: Never Used  Substance and Sexual Activity  . Alcohol use: No    Frequency: Never  . Drug use: No    Types: Methamphetamines, Heroin    Comment: Patient is in recovery from [place] and heroin.   Marland Kitchen Sexual activity: Yes    Partners: Female, Female    Birth control/protection: None  Lifestyle  . Physical activity:    Days per week: 4 days    Minutes per session: 30 min  . Stress: Very much  Relationships  . Social connections:    Talks on phone: Three times a week    Gets together: More than three times a week    Attends religious service: More than 4 times per year    Active member of club or organization: Yes    Attends meetings of clubs or organizations: More than 4 times per year    Relationship status: Never married  Other Topics Concern  . 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 She currently works for a [employer] and was previously incarcerated for [employer] and identity theft. She reports that she was an in prison for a year. She reports that she is in recovery from previously abusing methamphetamines and heroin. She attends NA meetings daily and has a sponsor. She works part time at [employer] in [place]. She notes that her transportation is limited due to needing to rely on others. She notes that her parents are deceased and is the black sheep of her family. She attended some college for video game simulation prior to being incarcerated. She reports that she was previously hospitalized for mental illness but is unable to remember the names of the places. She notes that she was previously diagnosed with Bipolar depression, borderline personality, disorder and substance abuse.  Additional Social History:    Allergies:   Allergies  Allergen Reactions  . Banana   . Penicillins   . Seroquel [Quetiapine Fumarate]     Restless leg    Labs:  Results for orders placed or performed during the hospital encounter of 05/27/18 (from the past 48 hour(s))  Comprehensive metabolic panel     Status: Abnormal   Collection Time: 05/27/18  8:37 PM  Result Value Ref Range   Sodium 135 135 - 145 mmol/L   Potassium 4.2 3.5 - 5.1 mmol/L   Chloride 100 98 - 111 mmol/L   CO2 28 22 - 32 mmol/L   Glucose, Bld 419 (H) 70 - 99 mg/dL   BUN 15 6 - 20 mg/dL   Creatinine, Ser 0.71 0.44 - 1.00 mg/dL   Calcium 9.1 8.9 - 10.3 mg/dL   Total Protein 7.3 6.5 - 8.1 g/dL   Albumin 3.5 3.5 - 5.0 g/dL   AST 33 15 - 41 U/L   ALT 62 (H) 0 - 44 U/L   Alkaline Phosphatase 96 38 - 126 U/L   Total Bilirubin 0.6 0.3 - 1.2 mg/dL   GFR calc non Af Amer >60 >60 mL/min   GFR calc Af Amer >60 >60 mL/min    Comment: (NOTE) The eGFR has been calculated using the CKD EPI equation. This  calculation has not been validated in all clinical situations. eGFR's persistently <60 mL/min signify possible Chronic Kidney Disease.    Anion gap 7 5 - 15    Comment: Performed at [facility], [address] [place], [place] [address]  Ethanol     Status: None   Collection Time: 05/27/18  8:37 PM  Result Value Ref Range   Alcohol, Ethyl (B) <10 <10 mg/dL    Comment: (NOTE) Lowest detectable limit for serum alcohol is 10 mg/dL. For medical purposes only. Performed at [facility], [address] [place], [place] [address]   Salicylate level     Status: None   Collection Time: 05/27/18  8:37 PM  Result Value Ref Range   Salicylate Lvl <3.2 2.8 - 30.0 mg/dL    Comment: Performed at [facility], [address] [place], [place] [address]  Acetaminophen level     Status: Abnormal   Collection Time: 05/27/18  8:37 PM  Result Value Ref Range   Acetaminophen (Tylenol), Serum <10 (L) 10 - 30 ug/mL    Comment: (NOTE) Therapeutic concentrations vary significantly. A range of 10-30 ug/mL  may be an effective concentration for many patients. However, some  are best treated at concentrations outside of this range. Acetaminophen concentrations >150 ug/mL at 4 hours after ingestion  and >50 ug/mL at 12 hours after ingestion are often associated with  toxic reactions. Performed at [facility], [address] [place], [place] [address]   cbc     Status: None   Collection Time: 05/27/18  8:37 PM  Result Value Ref Range   WBC 9.9 4.0 - 10.5 K/uL   RBC 5.07 3.87 - 5.11 MIL/uL   Hemoglobin 14.1 12.0 - 15.0 g/dL   HCT 42.0 36.0 - 46.0 %   MCV 82.8 80.0 - 100.0 fL   MCH 27.8 26.0 - 34.0 pg   MCHC 33.6 30.0 - 36.0 g/dL   RDW 12.3 11.5 - 15.5 %   Platelets 303 150 - 400 K/uL   nRBC 0.0 0.0 - 0.2 %    Comment: Performed at [facility], [address] [place], [place] [address]  Urine Drug Screen, Qualitative     Status: None    Collection Time: 05/27/18  8:37 PM  Result Value Ref Range   Tricyclic, Ur Screen NONE DETECTED NONE DETECTED   Amphetamines, Ur Screen NONE DETECTED NONE DETECTED   MDMA (Ecstasy)Ur Screen NONE DETECTED NONE DETECTED   Cocaine Metabolite,Ur [place] NONE DETECTED NONE DETECTED   Opiate, Ur Screen NONE DETECTED NONE DETECTED   Phencyclidine (PCP) Ur S NONE DETECTED NONE DETECTED   Cannabinoid 50 Ng, Ur [place] NONE DETECTED NONE DETECTED   Barbiturates, Ur Screen NONE DETECTED NONE DETECTED   Benzodiazepine, Ur Scrn NONE DETECTED NONE DETECTED   Methadone Scn, Ur NONE DETECTED NONE DETECTED    Comment: (NOTE) Tricyclics + metabolites, urine    Cutoff 1000 ng/mL Amphetamines + metabolites, urine  Cutoff 1000 ng/mL MDMA (Ecstasy), urine              Cutoff 500 ng/mL Cocaine Metabolite, urine          Cutoff 300 ng/mL Opiate + metabolites, urine        Cutoff 300 ng/mL Phencyclidine (PCP), urine         Cutoff 25 ng/mL Cannabinoid, urine                 Cutoff 50 ng/mL Barbiturates + metabolites, urine  Cutoff 200 ng/mL Benzodiazepine, urine              Cutoff 200 ng/mL Methadone, urine                   Cutoff 300 ng/mL The urine drug screen provides only a preliminary, unconfirmed analytical test result and should not be used for non-medical purposes. Clinical consideration and professional judgment should be applied to any positive drug screen result due to possible interfering substances. A more specific alternate chemical method must be used in order to obtain a confirmed analytical result. Gas chromatography / mass spectrometry (GC/MS) is the preferred confirmat ory method. Performed at [facility], [address] [place], [place] [address]   Pregnancy, urine     Status: None   Collection Time: 05/27/18  8:37 PM  Result Value Ref Range   Preg Test, Ur NEGATIVE NEGATIVE    Comment: Performed at [facility], [address] [place], [place] [address]  Glucose,  capillary     Status: Abnormal   Collection Time: 05/28/18  6:09 AM  Result Value Ref Range   Glucose-Capillary 259 (H) 70 - 99 mg/dL  Glucose, capillary     Status: Abnormal   Collection Time: 05/28/18  2:20 PM  Result Value Ref Range   Glucose-Capillary 350 (H) 70 - 99 mg/dL   Comment 1 Notify RN     Current Facility-Administered Medications  Medication Dose Route Frequency Provider Last Rate Last Dose  . insulin aspart (novoLOG) injection 0-15 Units  0-15 Units Subcutaneous TID WC Darel Hong, MD   11 Units at 05/28/18 1540  . insulin aspart (novoLOG) injection 4 Units  4 Units Subcutaneous TID WC Darel Hong, MD       Current Outpatient Medications  Medication Sig Dispense Refill  . albuterol (PROVENTIL HFA;VENTOLIN HFA) 108 (90 Base) MCG/ACT inhaler Inhale 2 puffs into the lungs every 6 (six) hours as needed for wheezing or shortness of breath. (Patient not taking: Reported on 05/27/2018) 1 Inhaler 2  . glipiZIDE (GLUCOTROL) 5 MG tablet Take 1 tablet (5 mg total) by mouth 2 (two)  times daily. (Patient not taking: Reported on 05/27/2018) 60 tablet 11  . metoCLOPramide (REGLAN) 10 MG tablet Take 1 tablet (10 mg total) by mouth every 8 (eight) hours as needed for nausea or vomiting. (Patient not taking: Reported on 05/27/2018) 30 tablet 1  . naproxen (NAPROSYN) 500 MG tablet Take 1 tablet (500 mg total) by mouth 2 (two) times daily with a meal. (Patient not taking: Reported on 05/27/2018) 30 tablet 0    Musculoskeletal: Strength & Muscle Tone: within normal limits Gait & Station: normal Patient leans: N/A  Psychiatric Specialty Exam: Physical Exam  Nursing note and vitals reviewed. Constitutional: She appears well-developed and well-nourished.  HENT:  Head: Normocephalic and atraumatic.  Eyes: Pupils are equal, round, and reactive to light. Conjunctivae are normal.  Neck: Normal range of motion.  Cardiovascular: Regular rhythm and normal heart sounds.  Respiratory:  Effort normal. No respiratory distress.  GI: Soft.  Musculoskeletal: Normal range of motion.  Neurological: She is alert.  Skin: Skin is warm and dry.  Psychiatric: Her mood appears anxious. Her affect is angry. Her speech is tangential. She is agitated. She is not aggressive. Thought content is paranoid. Cognition and memory are impaired. She expresses impulsivity. She exhibits a depressed mood. She expresses no homicidal and no suicidal ideation.    Review of Systems  Constitutional: Negative.   HENT: Negative.   Eyes: Negative.   Respiratory: Negative.   Cardiovascular: Negative.   Gastrointestinal: Negative.   Musculoskeletal: Negative.   Skin: Negative.   Neurological: Negative.   Psychiatric/Behavioral: Positive for depression and memory loss. Negative for hallucinations, substance abuse and suicidal ideas. The patient is nervous/anxious and has insomnia.     Blood pressure (!) 108/57, pulse 88, temperature 98.3 F (36.8 C), temperature source Oral, resp. rate 18, height '5\' 5"'$  (1.651 m), weight 124.7 kg, SpO2 96 %.Body mass index is 45.76 kg/m.  General Appearance: Casual  Eye Contact:  Good  Speech:  Clear and Coherent  Volume:  Decreased  Mood:  Depressed and Irritable  Affect:  Labile and Full Range  Thought Process:  Disorganized  Orientation:  Full (Time, Place, and Person)  Thought Content:  Logical and Paranoid Ideation  Suicidal Thoughts:  Yes.  without intent/plan  Homicidal Thoughts:  No  Memory:  Immediate;   Fair Recent;   Fair Remote;   Fair  Judgement:  Impaired  Insight:  Shallow  Psychomotor Activity:  Decreased  Concentration:  Concentration: Fair  Recall:  [employer] of Knowledge:  Fair  Language:  Fair  Akathisia:  No  Handed:  Right  AIMS (if indicated):     Assets:  Desire for Improvement Housing Physical Health Resilience Social Support  ADL's:  Intact  Cognition:  WNL  Sleep:        Treatment Plan Summary: Daily contact with  patient to assess and evaluate symptoms and progress in treatment, Medication management and Plan Patient is requesting admission to the hospital because of paranoia and agitation past history of self injury.  She seems a little disorganized in her thinking.  Patient is agreeable to admission to the psychiatric hospital for stabilization.  She continues to refuse psychiatric medicine.  Patient is voluntary.  Orders completed.  Case reviewed with TTS and emergency room doctor  Disposition: Recommend psychiatric Inpatient admission when medically cleared. Supportive therapy provided about ongoing stressors.  Alethia Berthold, MD 05/28/2018 4:34 PM

## 2018-05-28 NOTE — ED Notes (Signed)
Patient alert and oriented x 4. Patient states she was seen [facility] in [place], [place]. Patient states she was diagnosed schizoaffective 3-4 months ago but could not afford her medication. Patient states she is feeling out of sorts and needs help. Patient denies SI/HI/AVH at time of assessment.

## 2018-05-28 NOTE — ED Notes (Signed)
Report to include Situation, Background, Assessment, and Recommendations received from RN Rhea. Patient alert and oriented, warm and dry, in no acute distress. Patient denies SI, HI, AVH and pain. Patient made aware of Q15 minute rounds and security cameras for their safety. Patient instructed to come to me with needs or concerns.

## 2018-05-28 NOTE — ED Notes (Signed)
Dr. Pershing ProudSchaevitz notified of patient's blood sugar.  Stated to give the patient sliding scale coverage for blood sugar of 350.
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
## 2018-05-28 NOTE — BH Assessment (Signed)
[facility] by Dr. Toni Amendlapacs.  Attending Physician will be Dr. Johnella MoloneyMcNew.   Patient [facility] Randa EvensJoanne.   ER staff is aware of the admission:  Emilie, ER Secretary    Dr. Pershing ProudSchaevitz, ER MD   Prudencio Burlyhea, Patient's Nurse   Donella StadeAnthony J., Patient Access.

## 2018-05-28 NOTE — Consult Note (Signed)
  Psychiatry: Patient seen chart reviewed.  Patient with history of schizoaffective disorder having increased paranoia and agitation with passive anger and suicidal thoughts.  Patient agreeable to admission to the psychiatric hospital.  Orders completed.  Case reviewed with TTS and ER physician.  Full note to follow

## 2018-05-28 NOTE — ED Notes (Addendum)
Pt. Transferred to BHU from ED to room 5 after screening for contraband. Report to include Situation, Background, Assessment and Recommendations from RN. Pt. Oriented to unit including Q15 minute rounds as well as the security cameras for their protection. Patient is alert and oriented, warm and dry in no acute distress. Patient denies SI, HI, and VH. Patient states she hears voices without command. Pt. Encouraged to let me know if needs arise.

## 2018-05-28 NOTE — ED Notes (Signed)
Dr. Alphonzo LemmingsMcShane notified of blood sugar results.  Stated that he was in the middle of something and would return my call.

## 2018-05-29 LAB — GLUCOSE, CAPILLARY
GLUCOSE-CAPILLARY: 198 mg/dL — AB (ref 70–99)
GLUCOSE-CAPILLARY: 225 mg/dL — AB (ref 70–99)

## 2018-05-29 NOTE — Progress Notes (Signed)
Inpatient Diabetes Program Recommendations  AACE/ADA: New Consensus Statement on Inpatient Glycemic Control (2019)  Target Ranges:  Prepandial:   less than 140 mg/dL      Peak postprandial:   less than 180 mg/dL (1-2 hours)      Critically ill patients:  140 - 180 mg/dL   Results for Robyn Melton, Robyn Melton (MRN [id number]) as of 05/29/2018 09:52  Ref. Range 05/28/2018 06:09 05/28/2018 14:20 05/28/2018 17:13 05/28/2018 19:55 05/29/2018 08:17  Glucose-Capillary Latest Ref Range: 70 - 99 mg/dL 409259 (H) 811350 (H) 914203 (H) 193 (H) 198 (H)   Review of Glycemic Control  Diabetes history: DM2 Outpatient Diabetes medications: Glipizide 5 mg BID Current orders for Inpatient glycemic control: Novolog 0-15 units TID with meals, Novolog 4 units TID with meals for meal coverage  Inpatient Diabetes Program Recommendations:  Oral Agents: Please consider ordering Glipizide XL 5 mg daily.  Thanks, Orlando PennerMarie Shantae Vantol, RN, MSN, CDE Diabetes Coordinator Inpatient Diabetes Program [phone] (Team Pager from 8am to 5pm)

## 2018-05-29 NOTE — ED Notes (Signed)
Pt will be discharged home.   Maintained on 15 minute checks and observation by security camera for safety.

## 2018-05-29 NOTE — Consult Note (Signed)
[facility] Consult   Reason for Consult: Follow-up 27 year old woman with history of mood instability Referring Physician: Lord Patient Identification: Robyn Melton MRN:  [id number] Principal Diagnosis: Schizoaffective disorder ([place]) Diagnosis:   Patient Active Problem List   Diagnosis Date Noted  . Borderline personality disorder ([place]) [F60.3] 05/28/2018  . Schizoaffective disorder ([place]) [F25.9] 05/28/2018  . Amphetamine abuse ([place]) [F15.10] 05/28/2018    Total Time spent with patient: 30 minutes  Subjective:   Robyn Melton is a 27 y.o. female patient admitted with "I do not think this is doing me any good".  HPI: Patient interviewed chart reviewed.  Patient had been scheduled for admission but unit had not been ready for her until today.  On reassessment the patient says she has changed her mind and no longer wants to be admitted to the psychiatric ward.  Patient states that she is not having any suicidal thoughts.  Her behavior here has been calm and appropriate without any signs of psychosis or agitation.  Patient has outpatient treatment already in place.  Denies suicidal thoughts.  Does not appear to be psychotic.  Has a safe place to live.  Does not appear to be an acute danger to herself.  She is not on commitment and does not meet commitment criteria.  Patient states she will follow up with outpatient treatment.  Past Psychiatric History: Long history of mood problems.  Diagnoses of schizoaffective versus personality disorder.  Currently not on any medication  Risk to Self: Suicidal Ideation: No Suicidal Intent: No-Not Currently/Within Last 6 Months Is patient at risk for suicide?: No Suicidal Plan?: No Access to Means: No What has been your use of drugs/alcohol within the last 12 months?: denies use How many times?: 0 Other Self Harm Risks: hx of cutting Triggers for Past Attempts: Unknown Intentional Self Injurious Behavior: Cutting Risk to Others:  Homicidal Ideation: No Thoughts of Harm to Others: No Current Homicidal Intent: No Current Homicidal Plan: No Access to Homicidal Means: No Identified Victim: n/a History of harm to others?: No Assessment of Violence: None Noted Violent Behavior Description: get angry easily Does patient have access to weapons?: No Criminal Charges Pending?: No Does patient have a court date: No Prior Inpatient Therapy: Prior Inpatient Therapy: Yes Prior Therapy Facilty/Provider(s): [facility] Reason for Treatment: Depression Prior Outpatient Therapy: Prior Outpatient Therapy: No Does patient have an ACCT team?: No Does patient have Intensive In-House Services?  : No Does patient have [facility] services? : No Does patient have P4CC services?: No  Past Medical History:  Past Medical History:  Diagnosis Date  . Anxiety   . Asthma   . Bipolar 1 disorder ([place])   . Borderline personality disorder ([place])   . Depression   . Hepatitis C   . PTSD (post-traumatic stress disorder)   . Schizoaffective disorder [facility])     Past Surgical History:  Procedure Laterality Date  . CESAREAN SECTION     times 2  . TONSILLECTOMY     Family History:  Family History  Problem Relation Age of Onset  . COPD Mother   . Asthma Mother   . Diabetes Mother   . Heart disease Mother   . Hypertension Mother   . Asthma Father   . Diabetes Father   . Kidney disease Father   . Hypertension Father   . Cancer Father    Family Psychiatric  History: See previous note Social History:  Social History   Substance and Sexual Activity  Alcohol Use  No  . Frequency: Never     Social History   Substance and Sexual Activity  Drug Use No  . Types: Methamphetamines, Heroin   Comment: Patient is in recovery from [place] and heroin.     Social History   Socioeconomic History  . Marital status: Single    Spouse name: Not on file  . Number of children: Not on file  . Years of education: some college  . Highest  education level: Some college, no degree  Occupational History    Comment: [employer] part time  Social Needs  . Financial resource strain: Somewhat hard  . Food insecurity:    Worry: Never true    Inability: Never true  . Transportation needs:    Medical: Yes    Non-medical: Yes  Tobacco Use  . Smoking status: Current Every Day Smoker    Packs/day: 0.20    Types: Cigarettes    Start date: 03/29/2009  . Smokeless tobacco: Never Used  Substance and Sexual Activity  . Alcohol use: No    Frequency: Never  . Drug use: No    Types: Methamphetamines, Heroin    Comment: Patient is in recovery from [place] and heroin.   Marland Kitchen Sexual activity: Yes    Partners: Female, Female    Birth control/protection: None  Lifestyle  . Physical activity:    Days per week: 4 days    Minutes per session: 30 min  . Stress: Very much  Relationships  . Social connections:    Talks on phone: Three times a week    Gets together: More than three times a week    Attends religious service: More than 4 times per year    Active member of club or organization: Yes    Attends meetings of clubs or organizations: More than 4 times per year    Relationship status: Never married  Other Topics Concern  . 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 She currently works for a [employer] and was previously incarcerated for [employer] and identity theft. She reports that she was an in prison for a year. She reports that she is in recovery from previously abusing methamphetamines and heroin. She attends NA meetings daily and has a sponsor. She works part time at [employer] in [place]. She notes that her transportation is limited due to needing to rely on others. She notes that her parents are deceased and is the black sheep of her family. She attended some college for video game simulation prior to being incarcerated. She reports that she  was previously hospitalized for mental illness but is unable to remember the names of the places. She notes that she was previously diagnosed with Bipolar depression, borderline personality, disorder and substance abuse.    Additional Social History:    Allergies:   Allergies  Allergen Reactions  . Banana   . Penicillins   . Seroquel [Quetiapine Fumarate]     Restless leg    Labs:  Results for orders placed or performed during the hospital encounter of 05/27/18 (from the past 48 hour(s))  Comprehensive metabolic panel     Status: Abnormal   Collection Time: 05/27/18  8:37 PM  Result Value Ref Range   Sodium 135 135 - 145 mmol/L   Potassium 4.2 3.5 - 5.1 mmol/L   Chloride 100 98 - 111 mmol/L   CO2 28 22 - 32 mmol/L   Glucose, Bld 419 (H) 70 -  99 mg/dL   BUN 15 6 - 20 mg/dL   Creatinine, Ser 0.71 0.44 - 1.00 mg/dL   Calcium 9.1 8.9 - 10.3 mg/dL   Total Protein 7.3 6.5 - 8.1 g/dL   Albumin 3.5 3.5 - 5.0 g/dL   AST 33 15 - 41 U/L   ALT 62 (H) 0 - 44 U/L   Alkaline Phosphatase 96 38 - 126 U/L   Total Bilirubin 0.6 0.3 - 1.2 mg/dL   GFR calc non Af Amer >60 >60 mL/min   GFR calc Af Amer >60 >60 mL/min    Comment: (NOTE) The eGFR has been calculated using the CKD EPI equation. This calculation has not been validated in all clinical situations. eGFR's persistently <60 mL/min signify possible Chronic Kidney Disease.    Anion gap 7 5 - 15    Comment: Performed at [facility], [address] [place], [place] [address]  Ethanol     Status: None   Collection Time: 05/27/18  8:37 PM  Result Value Ref Range   Alcohol, Ethyl (B) <10 <10 mg/dL    Comment: (NOTE) Lowest detectable limit for serum alcohol is 10 mg/dL. For medical purposes only. Performed at [facility], [address] [place], [place] [address]   Salicylate level     Status: None   Collection Time: 05/27/18  8:37 PM  Result Value Ref Range   Salicylate Lvl <5.9 2.8 - 30.0 mg/dL     Comment: Performed at [facility], [address] [place], [place] [address]  Acetaminophen level     Status: Abnormal   Collection Time: 05/27/18  8:37 PM  Result Value Ref Range   Acetaminophen (Tylenol), Serum <10 (L) 10 - 30 ug/mL    Comment: (NOTE) Therapeutic concentrations vary significantly. A range of 10-30 ug/mL  may be an effective concentration for many patients. However, some  are best treated at concentrations outside of this range. Acetaminophen concentrations >150 ug/mL at 4 hours after ingestion  and >50 ug/mL at 12 hours after ingestion are often associated with  toxic reactions. Performed at [facility], [address] [place], [place] [address]   cbc     Status: None   Collection Time: 05/27/18  8:37 PM  Result Value Ref Range   WBC 9.9 4.0 - 10.5 K/uL   RBC 5.07 3.87 - 5.11 MIL/uL   Hemoglobin 14.1 12.0 - 15.0 g/dL   HCT 42.0 36.0 - 46.0 %   MCV 82.8 80.0 - 100.0 fL   MCH 27.8 26.0 - 34.0 pg   MCHC 33.6 30.0 - 36.0 g/dL   RDW 12.3 11.5 - 15.5 %   Platelets 303 150 - 400 K/uL   nRBC 0.0 0.0 - 0.2 %    Comment: Performed at [facility], [address] [place], [place] [address]  Urine Drug Screen, Qualitative     Status: None   Collection Time: 05/27/18  8:37 PM  Result Value Ref Range   Tricyclic, Ur Screen NONE DETECTED NONE DETECTED   Amphetamines, Ur Screen NONE DETECTED NONE DETECTED   MDMA (Ecstasy)Ur Screen NONE DETECTED NONE DETECTED   Cocaine Metabolite,Ur [place] NONE DETECTED NONE DETECTED   Opiate, Ur Screen NONE DETECTED NONE DETECTED   Phencyclidine (PCP) Ur S NONE DETECTED NONE DETECTED   Cannabinoid 50 Ng, Ur [place] NONE DETECTED NONE DETECTED   Barbiturates, Ur Screen NONE DETECTED NONE DETECTED   Benzodiazepine, Ur Scrn NONE DETECTED NONE DETECTED   Methadone Scn,  Ur NONE DETECTED NONE DETECTED    Comment: (NOTE) Tricyclics + metabolites, urine    Cutoff 1000 ng/mL Amphetamines + metabolites, urine  Cutoff  1000 ng/mL MDMA (Ecstasy), urine              Cutoff 500 ng/mL Cocaine Metabolite, urine          Cutoff 300 ng/mL Opiate + metabolites, urine        Cutoff 300 ng/mL Phencyclidine (PCP), urine         Cutoff 25 ng/mL Cannabinoid, urine                 Cutoff 50 ng/mL Barbiturates + metabolites, urine  Cutoff 200 ng/mL Benzodiazepine, urine              Cutoff 200 ng/mL Methadone, urine                   Cutoff 300 ng/mL The urine drug screen provides only a preliminary, unconfirmed analytical test result and should not be used for non-medical purposes. Clinical consideration and professional judgment should be applied to any positive drug screen result due to possible interfering substances. A more specific alternate chemical method must be used in order to obtain a confirmed analytical result. Gas chromatography / mass spectrometry (GC/MS) is the preferred confirmat ory method. Performed at [facility], [address] [place], [place] [address]   Pregnancy, urine     Status: None   Collection Time: 05/27/18  8:37 PM  Result Value Ref Range   Preg Test, Ur NEGATIVE NEGATIVE    Comment: Performed at [facility], [address] [place],  [address]  Glucose, capillary     Status: Abnormal   Collection Time: 05/28/18  6:09 AM  Result Value Ref Range   Glucose-Capillary 259 (H) 70 - 99 mg/dL  Glucose, capillary     Status: Abnormal   Collection Time: 05/28/18  2:20 PM  Result Value Ref Range   Glucose-Capillary 350 (H) 70 - 99 mg/dL   Comment 1 Notify RN   Glucose, capillary     Status: Abnormal   Collection Time: 05/28/18  5:13 PM  Result Value Ref Range   Glucose-Capillary 203 (H) 70 - 99 mg/dL  Glucose, capillary     Status: Abnormal   Collection Time: 05/28/18  7:55 PM  Result Value Ref Range   Glucose-Capillary 193 (H) 70 - 99 mg/dL  Glucose, capillary     Status: Abnormal   Collection Time: 05/29/18  8:17 AM  Result Value Ref Range    Glucose-Capillary 198 (H) 70 - 99 mg/dL  Glucose, capillary     Status: Abnormal   Collection Time: 05/29/18 12:33 PM  Result Value Ref Range   Glucose-Capillary 225 (H) 70 - 99 mg/dL    Current Facility-Administered Medications  Medication Dose Route Frequency Provider Last Rate Last Dose  . insulin aspart (novoLOG) injection 0-15 Units  0-15 Units Subcutaneous TID WC Darel Hong, MD   5 Units at 05/29/18 1249  . insulin aspart (novoLOG) injection 4 Units  4 Units Subcutaneous TID WC Darel Hong, MD   4 Units at 05/29/18 1249   Current Outpatient Medications  Medication Sig Dispense Refill  . albuterol (PROVENTIL HFA;VENTOLIN HFA) 108 (90 Base) MCG/ACT inhaler Inhale 2 puffs into the lungs every 6 (six) hours as needed for wheezing or shortness of breath. (Patient not taking: Reported on 05/27/2018) 1 Inhaler 2  . glipiZIDE (GLUCOTROL) 5 MG tablet Take  1 tablet (5 mg total) by mouth 2 (two) times daily. (Patient not taking: Reported on 05/27/2018) 60 tablet 11  . metoCLOPramide (REGLAN) 10 MG tablet Take 1 tablet (10 mg total) by mouth every 8 (eight) hours as needed for nausea or vomiting. (Patient not taking: Reported on 05/27/2018) 30 tablet 1  . naproxen (NAPROSYN) 500 MG tablet Take 1 tablet (500 mg total) by mouth 2 (two) times daily with a meal. (Patient not taking: Reported on 05/27/2018) 30 tablet 0    Musculoskeletal: Strength & Muscle Tone: within normal limits Gait & Station: normal Patient leans: N/A  Psychiatric Specialty Exam: Physical Exam  Nursing note and vitals reviewed. Constitutional: She appears well-developed and well-nourished.  HENT:  Head: Normocephalic and atraumatic.  Eyes: Pupils are equal, round, and reactive to light. Conjunctivae are normal.  Neck: Normal range of motion.  Cardiovascular: Regular rhythm and normal heart sounds.  Respiratory: Effort normal. No respiratory distress.  GI: Soft.  Musculoskeletal: Normal range of motion.   Neurological: She is alert.  Skin: Skin is warm and dry.  Psychiatric: She has a normal mood and affect. Her speech is normal and behavior is normal. Judgment and thought content normal. Cognition and memory are normal.    Review of Systems  Constitutional: Negative.   HENT: Negative.   Eyes: Negative.   Respiratory: Negative.   Cardiovascular: Negative.   Gastrointestinal: Negative.   Musculoskeletal: Negative.   Skin: Negative.   Neurological: Negative.   Psychiatric/Behavioral: Negative.     Blood pressure 103/65, pulse 73, temperature 98.2 F (36.8 C), temperature source Oral, resp. rate 18, height '5\' 5"'$  (1.651 m), weight 124.7 kg, SpO2 98 %.Body mass index is 45.76 kg/m.  General Appearance: Casual  Eye Contact:  Good  Speech:  Clear and Coherent  Volume:  Normal  Mood:  Euthymic  Affect:  Congruent  Thought Process:  Goal Directed  Orientation:  Full (Time, Place, and Person)  Thought Content:  Logical  Suicidal Thoughts:  No  Homicidal Thoughts:  No  Memory:  Immediate;   Fair Recent;   Fair Remote;   Fair  Judgement:  Fair  Insight:  Fair  Psychomotor Activity:  Normal  Concentration:  Concentration: Fair  Recall:  [employer] of Knowledge:  Fair  Language:  Fair  Akathisia:  No  Handed:  Right  AIMS (if indicated):     Assets:  Communication Skills Desire for Improvement Physical Health  ADL's:  Intact  Cognition:  WNL  Sleep:        Treatment Plan Summary: Plan Patient no longer is wanting or agreeable to being admitted to the psychiatric hospital.  She does not meet commitment criteria.  I think under the circumstances this is an understandable decision.  Encourage patient to continue with her plans to stay sober and stay involved in NA and to stay involved with therapy.  She is agreeable to all of this.  Patient's case will be reviewed with TTS and emergency room physician and she can be released home.  Disposition: No evidence of imminent risk to  self or others at present.   Patient does not meet criteria for psychiatric inpatient admission. Supportive therapy provided about ongoing stressors.  Alethia Berthold, MD 05/29/2018 2:38 PM

## 2018-05-29 NOTE — ED Notes (Signed)
Hourly rounding reveals patient in room. No complaints, stable, in no acute distress. Q15 minute rounds and monitoring via Security Cameras to continue. 

## 2018-05-29 NOTE — ED Notes (Signed)
Pt stated she no longer wants to be admitted for inpatient treatment. Dr. Toni Amendlapacs notified.   Maintained on 15 minute checks and observation by security camera for safety.

## 2018-05-29 NOTE — ED Provider Notes (Signed)
The patient has been evaluated at bedside by Dr. Clapacs, psychiatry.  Patient is clinically stable.  Not felt to be a danger to self or others.  No SI or Hi.  No indication for inpatient psychiatric admission at this time.  Appropriate for continued outpatient therapy.    Long Brimage, MD 05/29/18 1524  

## 2018-05-29 NOTE — ED Notes (Signed)
Pt discharged home. VS stable.  Pt denies SI/HI.  Discharge instructions reviewed with patient. All belongings (including purse and cell phone) returned to patient.

## 2019-01-04 ENCOUNTER — Ambulatory Visit: Payer: Self-pay | Admitting: Surgery

## 2019-01-06 ENCOUNTER — Ambulatory Visit: Payer: Self-pay | Admitting: Surgery

## 2019-01-14 ENCOUNTER — Ambulatory Visit: Payer: Medicaid Other | Attending: Neurology

## 2019-01-18 ENCOUNTER — Ambulatory Visit: Payer: Self-pay | Admitting: Surgery

## 2019-02-13 DEATH — deceased

## 2019-04-01 ENCOUNTER — Encounter: Payer: Self-pay | Admitting: *Deleted

## 2020-01-26 IMAGING — US US ABDOMEN COMPLETE
1 series · 14 of 25 positions shown · non-contrast
Comparison: None.

CLINICAL DATA: Chronic hepatitis-C

EXAM:
ABDOMEN ULTRASOUND COMPLETE

[Series 1: us abdomen complete · 0.28mm/px · 14 of 66 slices shown]
[im 1/66]
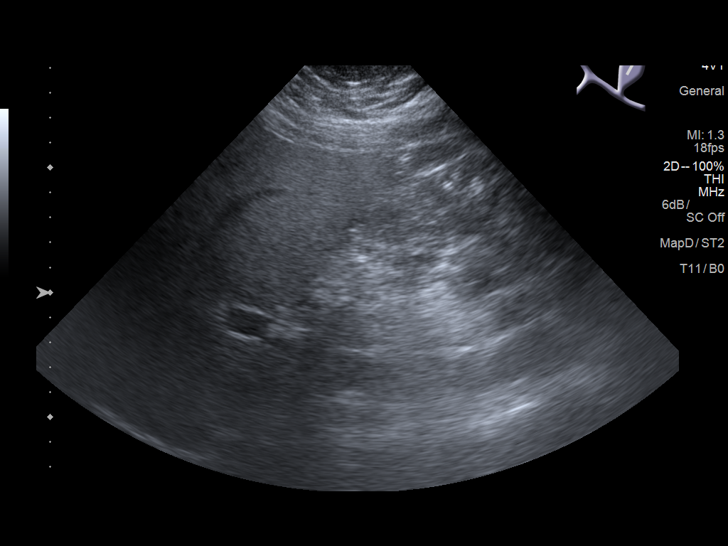
[im 6/66]
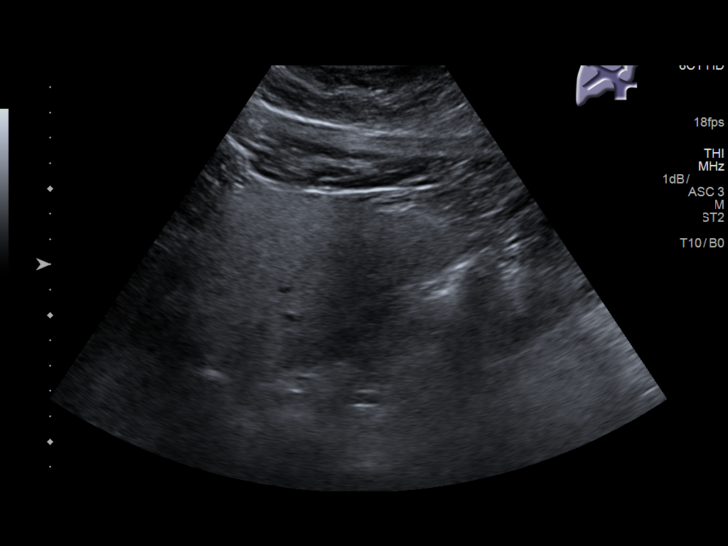
[im 11/66]
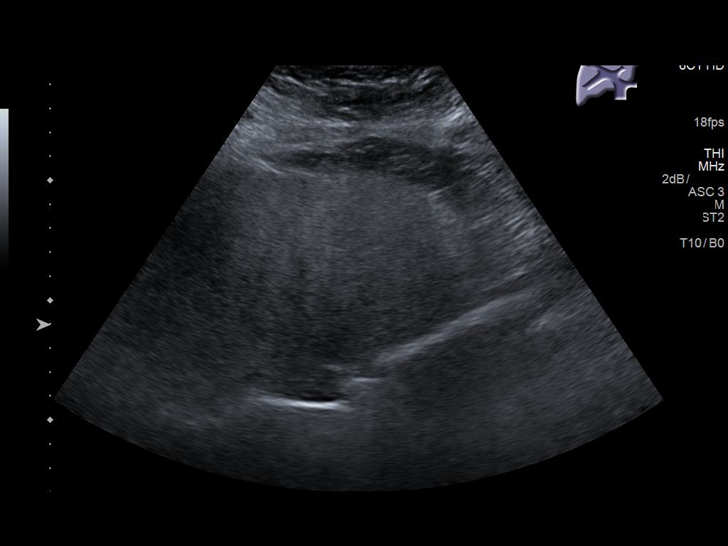
[im 17/66]
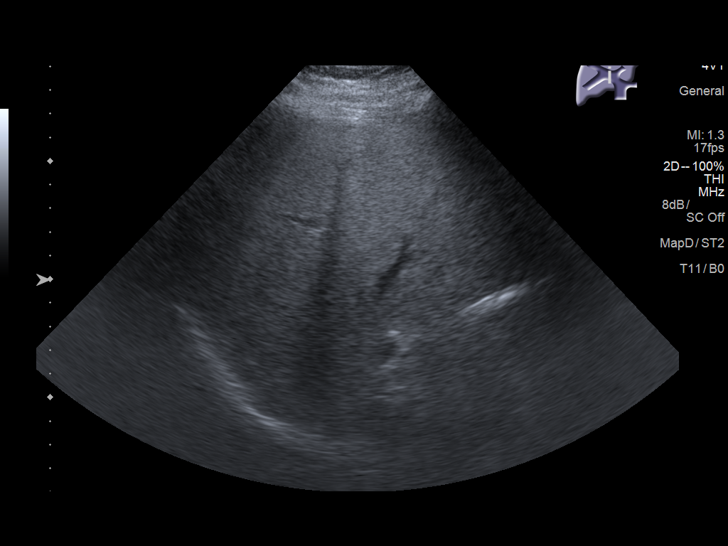
[im 22/66]
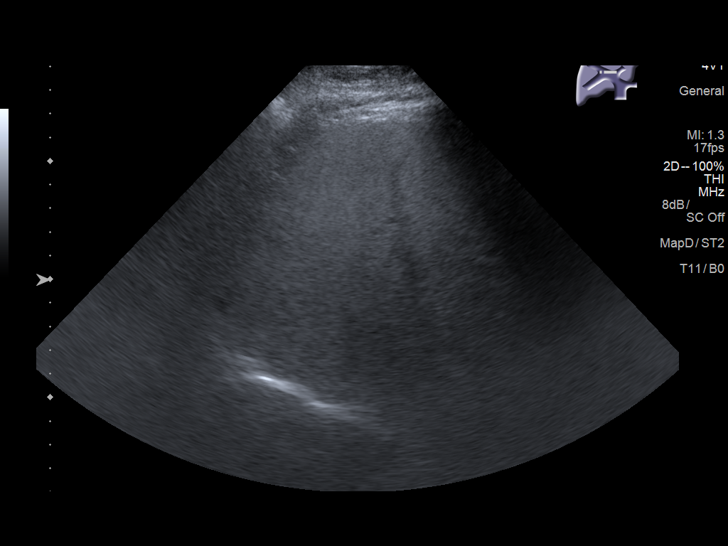
[im 25/66]
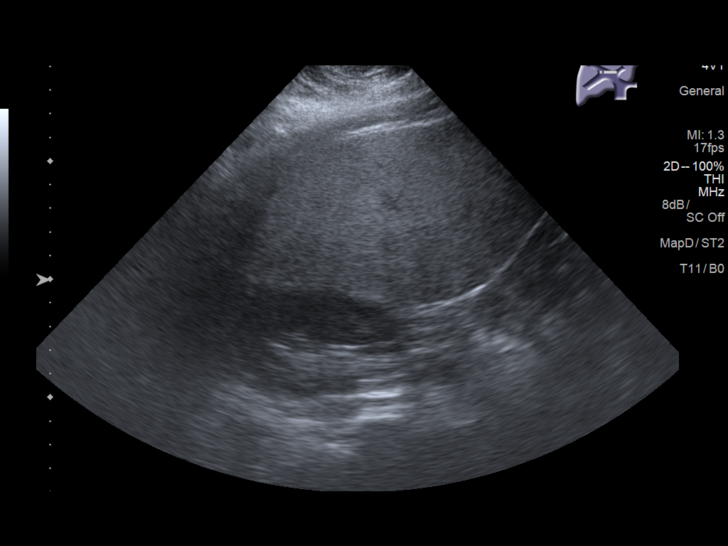
[im 30/66]
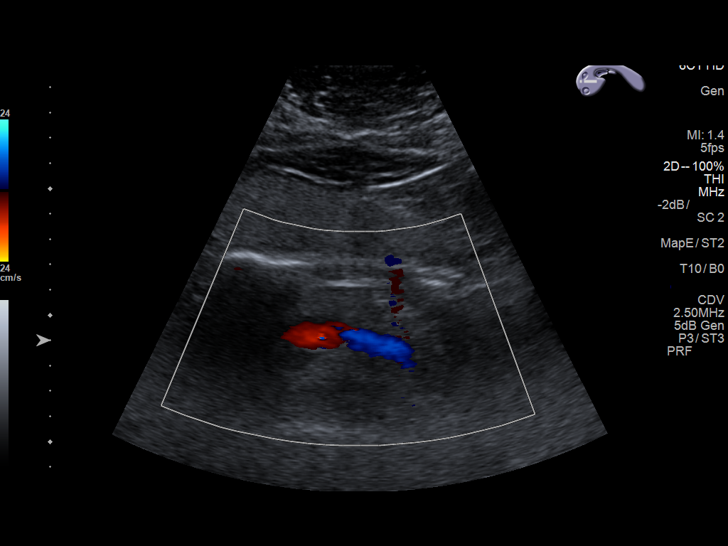
[im 36/66]
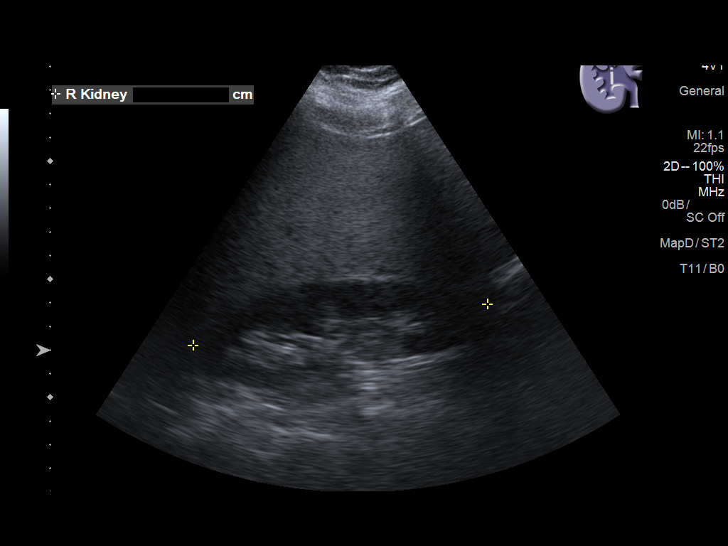
[im 41/66]
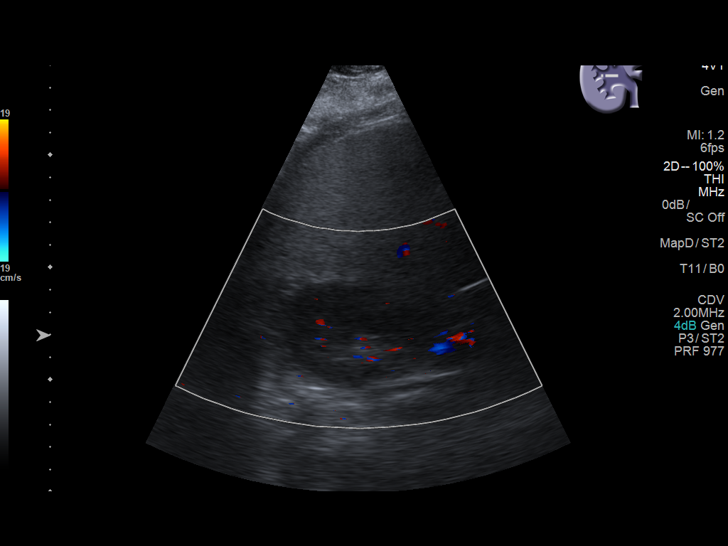
[im 44/66]
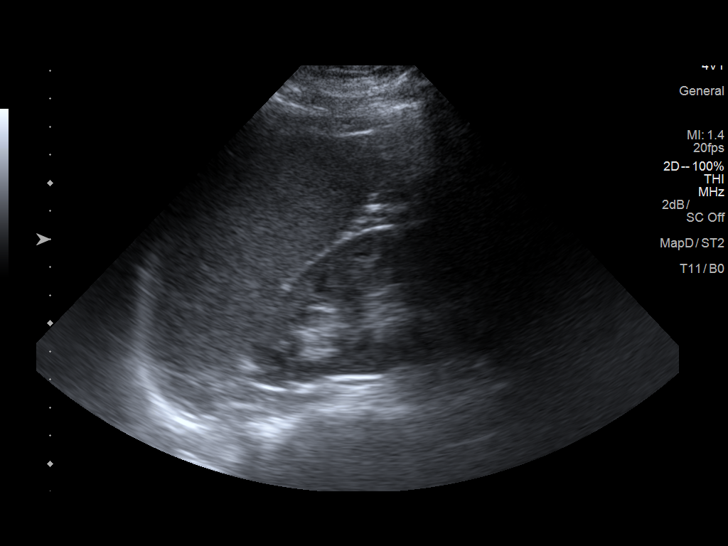
[im 49/66]
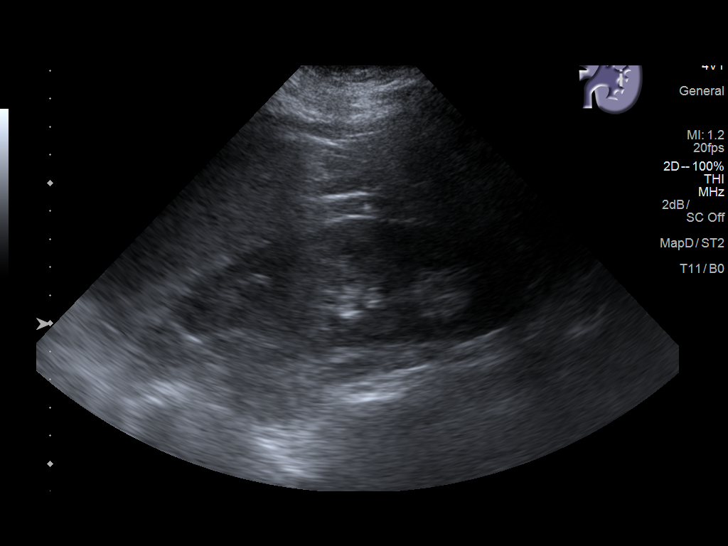
[im 55/66]
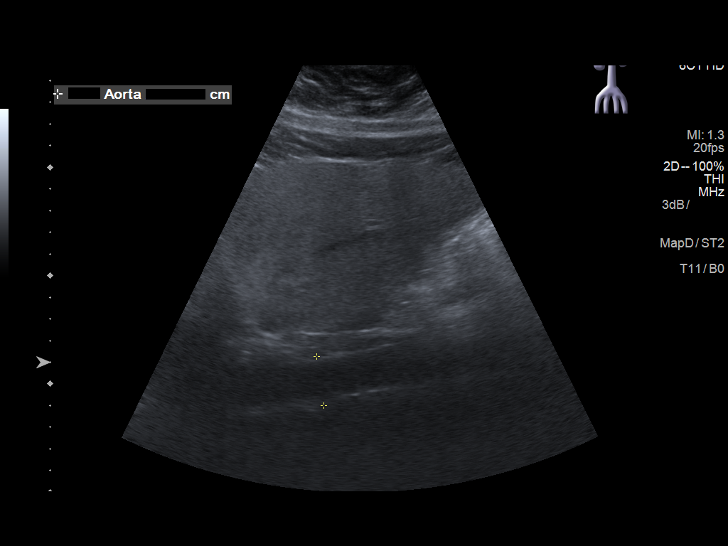
[im 60/66]
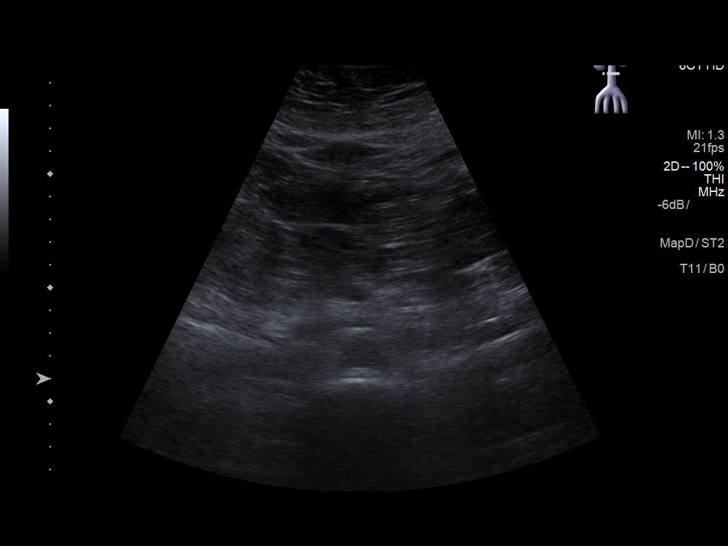
[im 66/66]
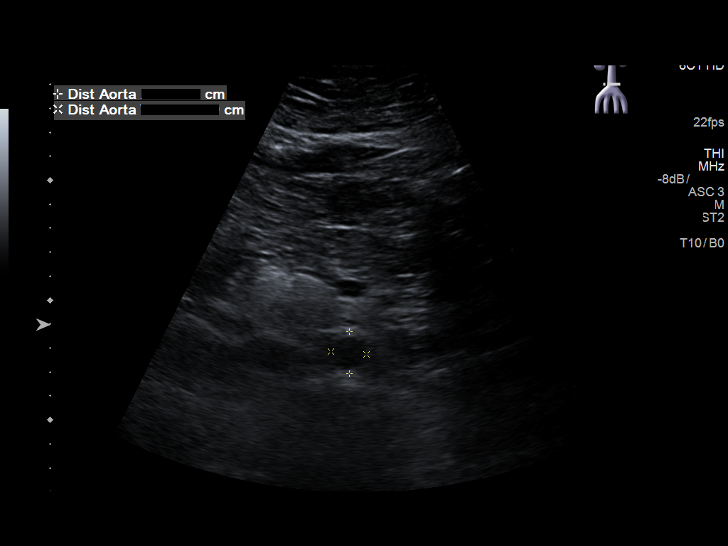

[14 of 25 positions shown; findings below may reference images not displayed]

FINDINGS: Gallbladder: Not well visualized. By patient history, no gallbladder
is present.

Common bile duct: Diameter: 4 mm

Liver: Diffuse increased echogenicity is identified within the liver
without focal mass. This is likely related to the known chronic
hepatitis C. Portal vein is patent on color Doppler imaging with
normal direction of blood flow towards the liver.

IVC: No abnormality visualized.

Pancreas: Visualized portion unremarkable.

Spleen: Size and appearance within normal limits.

Right Kidney: Length: 12.6 cm. Echogenicity within normal limits. No
mass or hydronephrosis visualized.

Left Kidney: Length: 13.3 cm. Echogenicity within normal limits. No
mass or hydronephrosis visualized.

Abdominal aorta: No aneurysm visualized.

Other findings: None.
IMPRESSION: Increased echogenicity within the liver without focal mass. This is
likely related to the patient's given clinical history of chronic
hepatitis C.

Gallbladder is not visualized but by patient history is congenital
in nature.

No other focal abnormality is noted.
